# Patient Record
Sex: Female | Born: 1965 | Race: White | Hispanic: No | Marital: Single | Smoking: Former smoker
Health system: Southern US, Community
[De-identification: ages and names within clinical notes are randomized; demographics above are authoritative.]

## PROBLEM LIST (undated history)

## (undated) DIAGNOSIS — E669 Obesity, unspecified: Secondary | ICD-10-CM

## (undated) DIAGNOSIS — Z86010 Personal history of colonic polyps: Secondary | ICD-10-CM

## (undated) DIAGNOSIS — L732 Hidradenitis suppurativa: Secondary | ICD-10-CM

## (undated) DIAGNOSIS — D649 Anemia, unspecified: Secondary | ICD-10-CM

## (undated) DIAGNOSIS — J45909 Unspecified asthma, uncomplicated: Secondary | ICD-10-CM

## (undated) DIAGNOSIS — D219 Benign neoplasm of connective and other soft tissue, unspecified: Secondary | ICD-10-CM

## (undated) DIAGNOSIS — K219 Gastro-esophageal reflux disease without esophagitis: Secondary | ICD-10-CM

## (undated) DIAGNOSIS — L709 Acne, unspecified: Secondary | ICD-10-CM

## (undated) DIAGNOSIS — F411 Generalized anxiety disorder: Secondary | ICD-10-CM

## (undated) DIAGNOSIS — E0590 Thyrotoxicosis, unspecified without thyrotoxic crisis or storm: Secondary | ICD-10-CM

## (undated) DIAGNOSIS — T7840XA Allergy, unspecified, initial encounter: Secondary | ICD-10-CM

## (undated) DIAGNOSIS — Z9884 Bariatric surgery status: Secondary | ICD-10-CM

## (undated) HISTORY — PX: MYOMECTOMY: SHX85

## (undated) HISTORY — DX: Thyrotoxicosis, unspecified without thyrotoxic crisis or storm: E05.90

## (undated) HISTORY — DX: Generalized anxiety disorder: F41.1

## (undated) HISTORY — DX: Gastro-esophageal reflux disease without esophagitis: K21.9

## (undated) HISTORY — DX: Hidradenitis suppurativa: L73.2

## (undated) HISTORY — DX: Anemia, unspecified: D64.9

## (undated) HISTORY — DX: Acne, unspecified: L70.9

## (undated) HISTORY — PX: GASTRIC BYPASS: SHX52

## (undated) HISTORY — PX: TUBAL LIGATION: SHX77

## (undated) HISTORY — DX: Bariatric surgery status: Z98.84

## (undated) HISTORY — DX: Obesity, unspecified: E66.9

## (undated) HISTORY — PX: PILONIDAL CYST EXCISION: SHX744

## (undated) HISTORY — DX: Personal history of colonic polyps: Z86.010

## (undated) HISTORY — DX: Unspecified asthma, uncomplicated: J45.909

## (undated) HISTORY — PX: BREAST SURGERY: SHX581

## (undated) HISTORY — DX: Allergy, unspecified, initial encounter: T78.40XA

---

## 2004-03-03 ENCOUNTER — Ambulatory Visit: Payer: Self-pay | Admitting: Gastroenterology

## 2004-03-15 ENCOUNTER — Ambulatory Visit: Payer: Self-pay | Admitting: Endocrinology

## 2004-04-03 ENCOUNTER — Encounter (HOSPITAL_COMMUNITY): Admission: RE | Admit: 2004-04-03 | Discharge: 2004-04-20 | Payer: Self-pay | Admitting: Endocrinology

## 2004-08-02 ENCOUNTER — Ambulatory Visit: Payer: Self-pay | Admitting: Internal Medicine

## 2004-09-26 ENCOUNTER — Ambulatory Visit: Payer: Self-pay | Admitting: Internal Medicine

## 2005-06-06 ENCOUNTER — Ambulatory Visit: Payer: Self-pay | Admitting: Internal Medicine

## 2005-07-11 ENCOUNTER — Ambulatory Visit: Payer: Self-pay | Admitting: Internal Medicine

## 2005-07-24 ENCOUNTER — Encounter: Admission: RE | Admit: 2005-07-24 | Discharge: 2005-07-24 | Payer: Self-pay | Admitting: Internal Medicine

## 2005-08-30 ENCOUNTER — Ambulatory Visit: Payer: Self-pay | Admitting: Internal Medicine

## 2006-06-20 ENCOUNTER — Ambulatory Visit: Payer: Self-pay | Admitting: Internal Medicine

## 2006-06-20 LAB — CONVERTED CEMR LAB
BUN: 6 mg/dL (ref 6–23)
Basophils Absolute: 0 10*3/uL (ref 0.0–0.1)
Basophils Relative: 0.7 % (ref 0.0–1.0)
CO2: 29 meq/L (ref 19–32)
Calcium: 9.5 mg/dL (ref 8.4–10.5)
Chloride: 103 meq/L (ref 96–112)
Creatinine, Ser: 0.7 mg/dL (ref 0.4–1.2)
Eosinophils Absolute: 0.1 10*3/uL (ref 0.0–0.6)
Eosinophils Relative: 1.1 % (ref 0.0–5.0)
GFR calc Af Amer: 119 mL/min
GFR calc non Af Amer: 98 mL/min
Glucose, Bld: 85 mg/dL (ref 70–99)
HCT: 39.6 % (ref 36.0–46.0)
Hemoglobin: 13.4 g/dL (ref 12.0–15.0)
Lymphocytes Relative: 33.6 % (ref 12.0–46.0)
MCHC: 33.9 g/dL (ref 30.0–36.0)
MCV: 92.6 fL (ref 78.0–100.0)
Monocytes Absolute: 0.4 10*3/uL (ref 0.2–0.7)
Monocytes Relative: 6 % (ref 3.0–11.0)
Neutro Abs: 4.1 10*3/uL (ref 1.4–7.7)
Neutrophils Relative %: 58.6 % (ref 43.0–77.0)
Platelets: 388 10*3/uL (ref 150–400)
Potassium: 5.3 meq/L — ABNORMAL HIGH (ref 3.5–5.1)
RBC: 4.28 M/uL (ref 3.87–5.11)
RDW: 12.2 % (ref 11.5–14.6)
Sodium: 137 meq/L (ref 135–145)
TSH: 1.5 microintl units/mL (ref 0.35–5.50)
WBC: 7 10*3/uL (ref 4.5–10.5)

## 2006-10-01 ENCOUNTER — Ambulatory Visit: Payer: Self-pay | Admitting: Internal Medicine

## 2006-11-04 ENCOUNTER — Ambulatory Visit: Payer: Self-pay | Admitting: Internal Medicine

## 2006-12-11 DIAGNOSIS — J45909 Unspecified asthma, uncomplicated: Secondary | ICD-10-CM | POA: Insufficient documentation

## 2006-12-11 DIAGNOSIS — Z860100 Personal history of colon polyps, unspecified: Secondary | ICD-10-CM | POA: Insufficient documentation

## 2006-12-11 DIAGNOSIS — K219 Gastro-esophageal reflux disease without esophagitis: Secondary | ICD-10-CM

## 2006-12-11 HISTORY — DX: Personal history of colonic polyps: Z86.010

## 2006-12-11 HISTORY — DX: Personal history of colon polyps, unspecified: Z86.0100

## 2006-12-11 HISTORY — DX: Unspecified asthma, uncomplicated: J45.909

## 2006-12-11 HISTORY — DX: Gastro-esophageal reflux disease without esophagitis: K21.9

## 2007-02-07 ENCOUNTER — Ambulatory Visit: Payer: Self-pay | Admitting: Internal Medicine

## 2007-02-20 ENCOUNTER — Ambulatory Visit: Payer: Self-pay | Admitting: Gastroenterology

## 2007-03-04 ENCOUNTER — Ambulatory Visit: Payer: Self-pay | Admitting: Gastroenterology

## 2007-08-04 ENCOUNTER — Ambulatory Visit: Payer: Self-pay | Admitting: Internal Medicine

## 2008-05-10 ENCOUNTER — Ambulatory Visit: Payer: Self-pay | Admitting: Internal Medicine

## 2008-05-10 DIAGNOSIS — E669 Obesity, unspecified: Secondary | ICD-10-CM | POA: Insufficient documentation

## 2008-05-10 HISTORY — DX: Obesity, unspecified: E66.9

## 2008-06-09 ENCOUNTER — Telehealth: Payer: Self-pay | Admitting: Internal Medicine

## 2008-06-11 ENCOUNTER — Encounter: Payer: Self-pay | Admitting: Internal Medicine

## 2008-06-15 ENCOUNTER — Encounter: Payer: Self-pay | Admitting: Internal Medicine

## 2008-07-09 ENCOUNTER — Encounter: Payer: Self-pay | Admitting: Gastroenterology

## 2008-07-19 ENCOUNTER — Encounter: Payer: Self-pay | Admitting: Internal Medicine

## 2008-07-22 ENCOUNTER — Ambulatory Visit (HOSPITAL_COMMUNITY): Admission: RE | Admit: 2008-07-22 | Discharge: 2008-07-22 | Payer: Self-pay | Admitting: Surgery

## 2008-07-29 ENCOUNTER — Ambulatory Visit (HOSPITAL_COMMUNITY): Admission: RE | Admit: 2008-07-29 | Discharge: 2008-07-29 | Payer: Self-pay | Admitting: Surgery

## 2008-07-29 ENCOUNTER — Encounter: Admission: RE | Admit: 2008-07-29 | Discharge: 2008-07-29 | Payer: Self-pay | Admitting: Surgery

## 2008-08-12 ENCOUNTER — Ambulatory Visit: Payer: Self-pay | Admitting: Internal Medicine

## 2008-09-29 ENCOUNTER — Encounter: Payer: Self-pay | Admitting: Internal Medicine

## 2008-10-21 ENCOUNTER — Encounter: Admission: RE | Admit: 2008-10-21 | Discharge: 2009-01-19 | Payer: Self-pay | Admitting: Surgery

## 2008-11-02 ENCOUNTER — Ambulatory Visit: Payer: Self-pay | Admitting: Vascular Surgery

## 2008-11-02 ENCOUNTER — Inpatient Hospital Stay (HOSPITAL_COMMUNITY): Admission: RE | Admit: 2008-11-02 | Discharge: 2008-11-04 | Payer: Self-pay | Admitting: Surgery

## 2008-11-03 ENCOUNTER — Encounter (INDEPENDENT_AMBULATORY_CARE_PROVIDER_SITE_OTHER): Payer: Self-pay | Admitting: Surgery

## 2008-11-14 ENCOUNTER — Inpatient Hospital Stay (HOSPITAL_COMMUNITY): Admission: EM | Admit: 2008-11-14 | Discharge: 2008-11-14 | Payer: Self-pay | Admitting: Emergency Medicine

## 2008-11-19 ENCOUNTER — Encounter: Payer: Self-pay | Admitting: Gastroenterology

## 2008-11-23 ENCOUNTER — Ambulatory Visit: Payer: Self-pay | Admitting: Family Medicine

## 2008-12-23 ENCOUNTER — Encounter: Payer: Self-pay | Admitting: Gastroenterology

## 2009-01-07 ENCOUNTER — Ambulatory Visit (HOSPITAL_COMMUNITY): Admission: RE | Admit: 2009-01-07 | Discharge: 2009-01-07 | Payer: Self-pay | Admitting: Surgery

## 2009-01-25 ENCOUNTER — Encounter: Admission: RE | Admit: 2009-01-25 | Discharge: 2009-01-25 | Payer: Self-pay | Admitting: Surgery

## 2009-02-08 ENCOUNTER — Encounter (INDEPENDENT_AMBULATORY_CARE_PROVIDER_SITE_OTHER): Payer: Self-pay | Admitting: *Deleted

## 2009-02-09 ENCOUNTER — Ambulatory Visit: Payer: Self-pay | Admitting: Internal Medicine

## 2009-02-16 ENCOUNTER — Ambulatory Visit: Payer: Self-pay | Admitting: Internal Medicine

## 2009-02-16 DIAGNOSIS — Z9884 Bariatric surgery status: Secondary | ICD-10-CM | POA: Insufficient documentation

## 2009-02-16 DIAGNOSIS — F411 Generalized anxiety disorder: Secondary | ICD-10-CM

## 2009-02-16 HISTORY — DX: Generalized anxiety disorder: F41.1

## 2009-02-16 HISTORY — DX: Bariatric surgery status: Z98.84

## 2009-02-18 LAB — CONVERTED CEMR LAB
ALT: 16 units/L (ref 0–35)
AST: 16 units/L (ref 0–37)
Albumin: 3.5 g/dL (ref 3.5–5.2)
Alkaline Phosphatase: 75 units/L (ref 39–117)
BUN: 10 mg/dL (ref 6–23)
Basophils Absolute: 0 10*3/uL (ref 0.0–0.1)
Basophils Relative: 0.8 % (ref 0.0–3.0)
Bilirubin, Direct: 0.1 mg/dL (ref 0.0–0.3)
CO2: 26 meq/L (ref 19–32)
Calcium: 8.8 mg/dL (ref 8.4–10.5)
Chloride: 104 meq/L (ref 96–112)
Creatinine, Ser: 0.7 mg/dL (ref 0.4–1.2)
Eosinophils Absolute: 0.1 10*3/uL (ref 0.0–0.7)
Eosinophils Relative: 1.2 % (ref 0.0–5.0)
GFR calc non Af Amer: 96.46 mL/min (ref >60)
Glucose, Bld: 83 mg/dL (ref 70–99)
HCT: 37.2 % (ref 36.0–46.0)
Hemoglobin: 12.4 g/dL (ref 12.0–15.0)
Lymphocytes Relative: 35.4 % (ref 12.0–46.0)
Lymphs Abs: 2.1 10*3/uL (ref 0.7–4.0)
MCHC: 33.5 g/dL (ref 30.0–36.0)
MCV: 97.2 fL (ref 78.0–100.0)
Monocytes Absolute: 0.4 10*3/uL (ref 0.1–1.0)
Monocytes Relative: 7.1 % (ref 3.0–12.0)
Neutro Abs: 3.2 10*3/uL (ref 1.4–7.7)
Neutrophils Relative %: 55.5 % (ref 43.0–77.0)
Platelets: 331 10*3/uL (ref 150.0–400.0)
Potassium: 4.3 meq/L (ref 3.5–5.1)
RBC: 3.82 M/uL — ABNORMAL LOW (ref 3.87–5.11)
RDW: 12.7 % (ref 11.5–14.6)
Sodium: 136 meq/L (ref 135–145)
TSH: 0.86 microintl units/mL (ref 0.35–5.50)
Total Bilirubin: 0.6 mg/dL (ref 0.3–1.2)
Total Protein: 7.6 g/dL (ref 6.0–8.3)
WBC: 5.8 10*3/uL (ref 4.5–10.5)

## 2009-03-03 ENCOUNTER — Encounter (INDEPENDENT_AMBULATORY_CARE_PROVIDER_SITE_OTHER): Payer: Self-pay | Admitting: *Deleted

## 2009-03-03 ENCOUNTER — Encounter: Payer: Self-pay | Admitting: Gastroenterology

## 2009-03-15 ENCOUNTER — Ambulatory Visit: Payer: Self-pay | Admitting: Internal Medicine

## 2009-04-26 ENCOUNTER — Ambulatory Visit: Payer: Self-pay | Admitting: Internal Medicine

## 2009-09-05 ENCOUNTER — Telehealth: Payer: Self-pay | Admitting: Internal Medicine

## 2009-10-26 ENCOUNTER — Ambulatory Visit: Payer: Self-pay | Admitting: Internal Medicine

## 2009-10-26 LAB — CONVERTED CEMR LAB
Blood in Urine, dipstick: NEGATIVE
Free T4: 1.62 ng/dL — ABNORMAL HIGH (ref 0.60–1.60)
Glucose, Urine, Semiquant: NEGATIVE
Ketones, urine, test strip: NEGATIVE
Nitrite: NEGATIVE
Protein, U semiquant: NEGATIVE
Specific Gravity, Urine: 1.025
T3, Free: 3.7 pg/mL (ref 2.3–4.2)
Urobilinogen, UA: 0.2
WBC Urine, dipstick: NEGATIVE
pH: 5.5

## 2009-10-27 LAB — CONVERTED CEMR LAB
ALT: 14 units/L (ref 0–35)
AST: 16 units/L (ref 0–37)
Albumin: 3.6 g/dL (ref 3.5–5.2)
Alkaline Phosphatase: 92 units/L (ref 39–117)
BUN: 10 mg/dL (ref 6–23)
Basophils Absolute: 0 10*3/uL (ref 0.0–0.1)
Basophils Relative: 0.2 % (ref 0.0–3.0)
Bilirubin, Direct: 0.2 mg/dL (ref 0.0–0.3)
CO2: 27 meq/L (ref 19–32)
Calcium: 9 mg/dL (ref 8.4–10.5)
Chloride: 108 meq/L (ref 96–112)
Cholesterol: 139 mg/dL (ref 0–200)
Creatinine, Ser: 0.5 mg/dL (ref 0.4–1.2)
Eosinophils Absolute: 0.1 10*3/uL (ref 0.0–0.7)
Eosinophils Relative: 1 % (ref 0.0–5.0)
Folate: 13.6 ng/mL
GFR calc non Af Amer: 132.57 mL/min (ref >60)
Glucose, Bld: 81 mg/dL (ref 70–99)
HCT: 36.1 % (ref 36.0–46.0)
HDL: 57.3 mg/dL (ref >39.00)
Hemoglobin: 12.2 g/dL (ref 12.0–15.0)
Iron: 133 ug/dL (ref 42–145)
LDL Cholesterol: 73 mg/dL (ref 0–99)
Lymphocytes Relative: 33.7 % (ref 12.0–46.0)
Lymphs Abs: 1.8 10*3/uL (ref 0.7–4.0)
MCHC: 33.9 g/dL (ref 30.0–36.0)
MCV: 92.3 fL (ref 78.0–100.0)
Magnesium: 1.9 mg/dL (ref 1.5–2.5)
Monocytes Absolute: 0.3 10*3/uL (ref 0.1–1.0)
Monocytes Relative: 5.4 % (ref 3.0–12.0)
Neutro Abs: 3.3 10*3/uL (ref 1.4–7.7)
Neutrophils Relative %: 59.7 % (ref 43.0–77.0)
Platelets: 318 10*3/uL (ref 150.0–400.0)
Potassium: 4.6 meq/L (ref 3.5–5.1)
RBC: 3.91 M/uL (ref 3.87–5.11)
RDW: 12.8 % (ref 11.5–14.6)
Saturation Ratios: 40.6 % (ref 20.0–50.0)
Sodium: 140 meq/L (ref 135–145)
TSH: 0.05 microintl units/mL — ABNORMAL LOW (ref 0.35–5.50)
Total Bilirubin: 0.7 mg/dL (ref 0.3–1.2)
Total CHOL/HDL Ratio: 2
Total Protein: 7 g/dL (ref 6.0–8.3)
Transferrin: 233.8 mg/dL (ref 212.0–360.0)
Triglycerides: 42 mg/dL (ref 0.0–149.0)
VLDL: 8.4 mg/dL (ref 0.0–40.0)
Vitamin B-12: 479 pg/mL (ref 211–911)
WBC: 5.5 10*3/uL (ref 4.5–10.5)

## 2009-11-02 ENCOUNTER — Ambulatory Visit: Payer: Self-pay | Admitting: Internal Medicine

## 2009-11-02 DIAGNOSIS — E0590 Thyrotoxicosis, unspecified without thyrotoxic crisis or storm: Secondary | ICD-10-CM | POA: Insufficient documentation

## 2009-11-02 HISTORY — DX: Thyrotoxicosis, unspecified without thyrotoxic crisis or storm: E05.90

## 2010-01-06 ENCOUNTER — Ambulatory Visit: Payer: Self-pay | Admitting: Internal Medicine

## 2010-01-09 LAB — CONVERTED CEMR LAB
Free T4: 0.69 ng/dL (ref 0.60–1.60)
T3, Free: 2.4 pg/mL (ref 2.3–4.2)
TSH: 1.25 microintl units/mL (ref 0.35–5.50)

## 2010-05-04 ENCOUNTER — Ambulatory Visit: Admit: 2010-05-04 | Payer: Self-pay | Admitting: Internal Medicine

## 2010-05-13 ENCOUNTER — Encounter: Payer: Self-pay | Admitting: Endocrinology

## 2010-05-23 NOTE — Assessment & Plan Note (Signed)
Summary: 6 WKS ROV/MM   Vital Signs:  Patient profile:   45 year old female Weight:      200 pounds Temp:     97.4 degrees F oral BP sitting:   108 / 78  (left arm) Cuff size:   regular  Vitals Entered By: Kern Reap CMA Duncan Dull) (April 26, 2009 8:43 AM)  Reason for Visit follow up medication, chest  congestion, cough   History of Present Illness: URI sxs for 2 weeks -- gradually improving.  Still has some cough---no fever or chills  anxiety---did not like effexor---"caused me too be loopy" . she resmued citalopram---doing well. feels anxiety is much better  weight---had bariatric surgery -- July 2010. Max weight 254  All other systems reviewed and were negative   Allergies (verified): No Known Drug Allergies  Past History:  Past Surgical History: Breast Cysts pyelonidal cyst CB X2 Colonoscopy November 02 2008---Gastric Bypass Ezzard Standing)  Family History: Reviewed history from 02/07/2007 and no changes required. Family History of Colon CA 1st degree relative <60 Family History Diabetes 1st degree relative-father Family History Hypertension Family History of Stroke M 1st degree relative <50  Family History of Digestive disorder  Social History: Reviewed history from 12/11/2006 and no changes required. Former Smoker Occupation: Single Alcohol use-no Drug use-no Regular exercise-yes  Review of Systems       All other systems reviewed and were negative   Physical Exam  General:  alert and well-developed.   Head:  normocephalic and atraumatic.   Eyes:  pupils equal and pupils round.   Ears:  R ear normal and L ear normal.   Neck:  No deformities, masses, or tenderness noted. Chest Wall:  No deformities, masses, or tenderness noted. Lungs:  Normal respiratory effort, chest expands symmetrically. Lungs are clear to auscultation, no crackles or wheezes. Heart:  regular rhythm and no murmur.   Abdomen:  Bowel sounds positive,abdomen soft and non-tender without  masses, organomegaly or hernias noted. Msk:  No deformity or scoliosis noted of thoracic or lumbar spine.   Neurologic:  cranial nerves II-XII intact and gait normal.   Skin:  turgor normal and color normal.   Psych:  memory intact for recent and remote and good eye contact.     Impression & Recommendations:  Problem # 1:  ANXIETY STATE, UNSPECIFIED (ICD-300.00) continue celexa call for any side effects  Problem # 2:  URI (ICD-465.9) no evidence of bacterial infection. call for any concerns, increased sxs, fever, persistence of sxs, wheeze, SOB.    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 Take one tablet by mouth daily 2)  Qvar 40 Mcg/act Aers (Beclomethasone dipropionate) .... 2 puffs 2 times daily  Patient Instructions: 1)  Please schedule a follow-up appointment in 6 months. Prescriptions: QVAR 40 MCG/ACT AERS (BECLOMETHASONE DIPROPIONATE) 2 puffs 2 times daily  #3 x 3   Entered and Authorized by:   Birdie Sons MD   Signed by:   Birdie Sons MD on 04/26/2009   Method used:   Electronically to        [facility]* (retail)       [address] [place], [place]  [address]       Ph: [phone]       Fax: [phone]   RxID:   [id number] CITALOPRAM  HYDROBROMIDE 20 MG TABS (CITALOPRAM HYDROBROMIDE) take one tablet by mouth daily  #90 x 3   Entered and Authorized by:   Birdie Sons MD   Signed by:   Birdie Sons MD on 04/26/2009   Method used:   Historical   RxID:   [id number]

## 2010-05-23 NOTE — Assessment & Plan Note (Signed)
Summary: cpx/no pap/njr   Vital Signs:  Patient profile:   45 year old female Menstrual status:  regular LMP:     10/18/2009 Height:      65 inches Weight:      186 pounds BMI:     31.06 Pulse rate:   60 / minute Pulse rhythm:   regular Resp:     12 per minute BP sitting:   114 / 70  (left arm) Cuff size:   regular  Vitals Entered By: Gladis Riffle, RN (November 02, 2009 9:27 AM)  Nutrition Counseling: Patient's BMI is greater than 25 and therefore counseled on weight management options. CC: cpx, labs done--has gyn--was on sucralfate liquid qid and wants to change to tablet Is Patient Diabetic? No LMP (date): 10/18/2009     Menstrual Status regular Enter LMP: 10/18/2009   CC:  cpx and labs done--has gyn--was on sucralfate liquid qid and wants to change to tablet.  History of Present Illness: cpx  Preventive Screening-Counseling & Management  Alcohol-Tobacco     Smoking Status: quit > 6 months     Year Started: 1989     Year Quit: 2001  Problems Prior to Update: 1)  Bariatric Surgery Status  (ICD-V45.86) 2)  Anxiety State, Unspecified  (ICD-300.00) 3)  Obesity  (ICD-278.00) 4)  Colonic Polyps, Hx of  (ICD-V12.72) 5)  Gerd  (ICD-530.81) 6)  Asthma  (ICD-493.90)  Current Problems (verified): 1)  Bariatric Surgery Status  (ICD-V45.86) 2)  Anxiety State, Unspecified  (ICD-300.00) 3)  Obesity  (ICD-278.00) 4)  Colonic Polyps, Hx of  (ICD-V12.72) 5)  Gerd  (ICD-530.81) 6)  Asthma  (ICD-493.90)  Medications Prior to Update: 1)  Citalopram Hydrobromide 20 Mg Tabs (Citalopram Hydrobromide) .... Take One Tablet By Mouth Daily 2)  Qvar 40 Mcg/act Aers (Beclomethasone Dipropionate) .... 2 Puffs 2 Times Daily  Current Medications (verified): 1)  Citalopram Hydrobromide 20 Mg Tabs (Citalopram Hydrobromide) .... Take One Tablet By Mouth Daily 2)  Qvar 40 Mcg/act Aers (Beclomethasone Dipropionate) .... 2 Puffs 2 Times Daily 3)  Sucralfate 1 Gm Tabs (Sucralfate) .... Take 1  Tablet By Mouth Four Times A Day  Allergies (verified): No Known Drug Allergies  Past History:  Past Medical History: Last updated: 12/11/2006 Rectal Polyp Acne Asthma GERD Colonic polyps, hx of  Past Surgical History: Last updated: 04/26/2009 Breast Cysts pyelonidal cyst CB X2 Colonoscopy November 02 2008---Gastric Bypass Ezzard Standing)  Family History: Last updated: 02/07/2007 Family History of Colon CA 1st degree relative <60 Family History Diabetes 1st degree relative-father Family History Hypertension Family History of Stroke M 1st degree relative <50  Family History of Digestive disorder  Social History: Last updated: 11/02/2009 Former Smoker Occupation: Single Alcohol use-no Drug use-no Regular exercise-yes--- 4 times weekly  Risk Factors: Exercise: yes (12/11/2006)  Risk Factors: Smoking Status: quit > 6 months (11/02/2009)  Social History: Former Smoker Occupation: Single Alcohol use-no Drug use-no Regular exercise-yes--- 4 times weekly  Physical Exam  General:  alert and well-developed.   Head:  normocephalic and atraumatic.   Eyes:  pupils equal and pupils round.   Ears:  R ear normal and L ear normal.   Neck:  No deformities, masses, or tenderness noted. Chest Wall:  No deformities, masses, or tenderness noted. Lungs:  Normal respiratory effort, chest expands symmetrically. Lungs are clear to auscultation, no crackles or wheezes. Heart:  regular rhythm and no murmur.   Abdomen:  Bowel sounds positive,abdomen soft and non-tender without masses, organomegaly or hernias noted. Msk:  No deformity or scoliosis noted of thoracic or lumbar spine.   Extremities:  No clubbing, cyanosis, edema, or deformity noted   Neurologic:  cranial nerves II-XII intact and gait normal.     Impression & Recommendations:  Problem # 1:  Preventive Health Care (ICD-V70.0) health maint continue exercise habits has had gastric bypass e=nocuraged her to maintain a low  calorie diet goal weight of 150 pounds discussed  Problem # 2:  HYPERTHYROIDISM (ICD-242.90) based on tsh t3 and t4 are normal will recheck in 2 months I have discussed with her sxs of hyperthyroid  Complete Medication List: 1)  Citalopram Hydrobromide 20 Mg Tabs (Citalopram hydrobromide) .... Take one tablet by mouth daily 2)  Qvar 40 Mcg/act Aers (Beclomethasone dipropionate) .... 2 puffs 2 times daily 3)  Sucralfate 1 Gm Tabs (Sucralfate) .... Take 1 tablet by mouth four times a day  Patient Instructions: 1)  2 months--labs only 2)  tsh 3)  free T3 4)  free T4 5)  Schedule 6 month office visit Prescriptions: SUCRALFATE 1 GM TABS (SUCRALFATE) Take 1 tablet by mouth four times a day  #100 x 3   Entered and Authorized by:   Birdie Sons MD   Signed by:   Birdie Sons MD on 11/02/2009   Method used:   Electronically to        [facility]  [phone]* (retail)       [address]       [place], [place]  [address]       Ph: [phone] or [phone]       Fax: [phone]   RxID:   [id number]    Immunization History:  Tetanus/Td Immunization History:    Tetanus/Td:  historical (08/02/2004)

## 2010-05-23 NOTE — Progress Notes (Signed)
Summary: Pt req to have additional labs done during cpx labs in July  Phone Note Call from Patient Call back at Home Phone [phone]   Caller: Patient Summary of Call: Pt had gastro bypass surgery done on 11/02/08 and she had orders to get lab work done to get b-complex and various other labs. Pt can't remember what other labs were ordered. Pt is sch to have cpx labs done on 11/02/09 and is req that labs be added. Pt is wondering is she can bring the old lab order with her to cpx lab appt? Please advise.      Initial call taken by: Lucy Antigua,  Sep 05, 2009 10:36 AM  Follow-up for Phone Call        yes Follow-up by: Birdie Sons MD,  Sep 05, 2009 12:53 PM  Additional Follow-up for Phone Call Additional follow up Details #1::        Pt has been informed. Additional Follow-up by: Lucy Antigua,  Sep 05, 2009 2:47 PM

## 2010-07-30 LAB — DIFFERENTIAL
Basophils Absolute: 0 10*3/uL (ref 0.0–0.1)
Basophils Absolute: 0 10*3/uL (ref 0.0–0.1)
Basophils Absolute: 0 10*3/uL (ref 0.0–0.1)
Basophils Relative: 0 % (ref 0–1)
Basophils Relative: 0 % (ref 0–1)
Basophils Relative: 0 % (ref 0–1)
Basophils Relative: 0 % (ref 0–1)
Eosinophils Absolute: 0.1 10*3/uL (ref 0.0–0.7)
Eosinophils Absolute: 0 10*3/uL (ref 0.0–0.7)
Eosinophils Absolute: 0.1 10*3/uL (ref 0.0–0.7)
Eosinophils Relative: 2 % (ref 0–5)
Eosinophils Relative: 1 % (ref 0–5)
Lymphocytes Relative: 30 % (ref 12–46)
Lymphocytes Relative: 41 % (ref 12–46)
Lymphs Abs: 2.3 10*3/uL (ref 0.7–4.0)
Lymphs Abs: 3 10*3/uL (ref 0.7–4.0)
Monocytes Absolute: 0.6 10*3/uL (ref 0.1–1.0)
Monocytes Absolute: 0.4 10*3/uL (ref 0.1–1.0)
Monocytes Absolute: 0.4 10*3/uL (ref 0.1–1.0)
Monocytes Absolute: 0.7 10*3/uL (ref 0.1–1.0)
Monocytes Relative: 8 % (ref 3–12)
Monocytes Relative: 6 % (ref 3–12)
Monocytes Relative: 6 % (ref 3–12)
Neutro Abs: 4.6 10*3/uL (ref 1.7–7.7)
Neutro Abs: 3.8 10*3/uL (ref 1.7–7.7)
Neutro Abs: 4.5 10*3/uL (ref 1.7–7.7)
Neutrophils Relative %: 60 % (ref 43–77)
Neutrophils Relative %: 51 % (ref 43–77)
Neutrophils Relative %: 66 % (ref 43–77)

## 2010-07-30 LAB — CBC
HCT: 34.1 % — ABNORMAL LOW (ref 36.0–46.0)
HCT: 34.9 % — ABNORMAL LOW (ref 36.0–46.0)
HCT: 37.1 % (ref 36.0–46.0)
HCT: 38.7 % (ref 36.0–46.0)
Hemoglobin: 11.3 g/dL — ABNORMAL LOW (ref 12.0–15.0)
Hemoglobin: 11.9 g/dL — ABNORMAL LOW (ref 12.0–15.0)
Hemoglobin: 11.7 g/dL — ABNORMAL LOW (ref 12.0–15.0)
Hemoglobin: 12.5 g/dL (ref 12.0–15.0)
Hemoglobin: 13.1 g/dL (ref 12.0–15.0)
MCHC: 33.1 g/dL (ref 30.0–36.0)
MCHC: 33.9 g/dL (ref 30.0–36.0)
MCHC: 33.7 g/dL (ref 30.0–36.0)
MCHC: 33.8 g/dL (ref 30.0–36.0)
MCHC: 33.9 g/dL (ref 30.0–36.0)
MCV: 93.4 fL (ref 78.0–100.0)
MCV: 93.5 fL (ref 78.0–100.0)
MCV: 92.8 fL (ref 78.0–100.0)
MCV: 94 fL (ref 78.0–100.0)
Platelets: 299 10*3/uL (ref 150–400)
Platelets: 307 10*3/uL (ref 150–400)
Platelets: 366 10*3/uL (ref 150–400)
RBC: 3.65 MIL/uL — ABNORMAL LOW (ref 3.87–5.11)
RBC: 3.74 MIL/uL — ABNORMAL LOW (ref 3.87–5.11)
RBC: 3.94 MIL/uL (ref 3.87–5.11)
RBC: 4.17 MIL/uL (ref 3.87–5.11)
RDW: 13 % (ref 11.5–15.5)
RDW: 13.2 % (ref 11.5–15.5)
RDW: 13 % (ref 11.5–15.5)
RDW: 13.1 % (ref 11.5–15.5)
WBC: 6.8 10*3/uL (ref 4.0–10.5)
WBC: 7.7 10*3/uL (ref 4.0–10.5)
WBC: 7.3 10*3/uL (ref 4.0–10.5)

## 2010-07-30 LAB — COMPREHENSIVE METABOLIC PANEL
ALT: 71 U/L — ABNORMAL HIGH (ref 0–35)
ALT: 91 U/L — ABNORMAL HIGH (ref 0–35)
ALT: 16 U/L (ref 0–35)
AST: 51 U/L — ABNORMAL HIGH (ref 0–37)
AST: 59 U/L — ABNORMAL HIGH (ref 0–37)
AST: 18 U/L (ref 0–37)
Albumin: 3 g/dL — ABNORMAL LOW (ref 3.5–5.2)
Albumin: 3.6 g/dL (ref 3.5–5.2)
Albumin: 3.8 g/dL (ref 3.5–5.2)
Alkaline Phosphatase: 68 U/L (ref 39–117)
Alkaline Phosphatase: 80 U/L (ref 39–117)
Alkaline Phosphatase: 77 U/L (ref 39–117)
BUN: 10 mg/dL (ref 6–23)
BUN: 8 mg/dL (ref 6–23)
BUN: 12 mg/dL (ref 6–23)
CO2: 23 mEq/L (ref 19–32)
CO2: 23 mEq/L (ref 19–32)
CO2: 26 mEq/L (ref 19–32)
Calcium: 8.4 mg/dL (ref 8.4–10.5)
Calcium: 9 mg/dL (ref 8.4–10.5)
Calcium: 9.3 mg/dL (ref 8.4–10.5)
Chloride: 106 mEq/L (ref 96–112)
Chloride: 107 mEq/L (ref 96–112)
Chloride: 102 mEq/L (ref 96–112)
Creatinine, Ser: 0.67 mg/dL (ref 0.4–1.2)
Creatinine, Ser: 0.74 mg/dL (ref 0.4–1.2)
Creatinine, Ser: 0.68 mg/dL (ref 0.4–1.2)
GFR calc Af Amer: >60 mL/min (ref >60)
GFR calc Af Amer: >60 mL/min (ref >60)
GFR calc Af Amer: >60 mL/min (ref >60)
GFR calc non Af Amer: >60 mL/min (ref >60)
GFR calc non Af Amer: >60 mL/min (ref >60)
GFR calc non Af Amer: >60 mL/min (ref >60)
Glucose, Bld: 75 mg/dL (ref 70–99)
Glucose, Bld: 79 mg/dL (ref 70–99)
Glucose, Bld: 79 mg/dL (ref 70–99)
Potassium: 4.4 mEq/L (ref 3.5–5.1)
Potassium: 4.7 mEq/L (ref 3.5–5.1)
Potassium: 4.4 mEq/L (ref 3.5–5.1)
Sodium: 137 mEq/L (ref 135–145)
Sodium: 138 mEq/L (ref 135–145)
Sodium: 136 mEq/L (ref 135–145)
Total Bilirubin: 1 mg/dL (ref 0.3–1.2)
Total Bilirubin: 1.3 mg/dL — ABNORMAL HIGH (ref 0.3–1.2)
Total Bilirubin: 0.5 mg/dL (ref 0.3–1.2)
Total Protein: 6.6 g/dL (ref 6.0–8.3)
Total Protein: 7.8 g/dL (ref 6.0–8.3)
Total Protein: 8.2 g/dL (ref 6.0–8.3)

## 2010-07-30 LAB — POCT PREGNANCY, URINE: Preg Test, Ur: NEGATIVE

## 2010-07-30 LAB — URINALYSIS, ROUTINE W REFLEX MICROSCOPIC
Glucose, UA: NEGATIVE mg/dL
Hgb urine dipstick: NEGATIVE
Ketones, ur: >80 mg/dL — AB
Nitrite: NEGATIVE
Protein, ur: NEGATIVE mg/dL
Specific Gravity, Urine: 1.028 (ref 1.005–1.030)
Urobilinogen, UA: 0.2 mg/dL (ref 0.0–1.0)
pH: 5.5 (ref 5.0–8.0)

## 2010-07-30 LAB — PREGNANCY, URINE: Preg Test, Ur: NEGATIVE

## 2010-07-30 LAB — LIPASE, BLOOD: Lipase: 19 U/L (ref 11–59)

## 2010-09-05 NOTE — Op Note (Signed)
Annette Brooks, Annette Brooks NO.:  [id number]   MEDICAL RECORD NO.:  [id number]          PATIENT TYPE:  INP   LOCATION:  1534                         FACILITY:  [facility]   PHYSICIAN:  Sandria Bales. Ezzard Standing, M.D.  DATE OF BIRTH:  May 09, 1965   DATE OF PROCEDURE:  DATE OF DISCHARGE:                               OPERATIVE REPORT   Date of Surgery ?   PREOPERATIVE DIAGNOSIS:  Morbid obesity (weight 253, BMI of 42.2).   POSTOPERATIVE DIAGNOSIS:  Morbid obesity (weight 253, BMI 42.2).   PROCEDURE:  Laparoscopic Roux-en-Y gastric bypass (antecolic  antegastric), upper endoscopy.   SURGEON:  Sandria Bales. Ezzard Standing, M.D.   FIRST ASSISTANT:  Thornton Park. Daphine Deutscher, M.D.   ANESTHESIA:  General endotracheal.   ESTIMATED BLOOD LOSS:  Minimal.   INDICATIONS FOR PROCEDURE:  Ms. Calles is a 45 year old female who is a  patient of Dr. Birdie Sons, who has completed our preop bariatric  program.  She is interested in a Roux-en-Y gastric bypass.  Discussed  with her the indications and potential complications of bypass surgery.  Potential complications of gastric bypass surgery include, but are not  limited to, bleeding, infection, bowel leak, blood clots, pulmonary  embolism, and long-term nutritional consequences.   OPERATIVE NOTE:  Patient was placed in a supine position.  She was given  antibiotics at the initiation of the procedure; however, was prepped  with ChloraPrep.  A Foley catheter was placed. The surgical check list  was run.   Her abdomen was sterilely draped, and abdomen accessed through the left  upper quadrant with an 12 mm OptiVu trocar.  I placed 6 additional  trocars.  I placed a 5 mm trocar lateral left subcostal.  I placed a 5  mm trocar subxiphoid for liver retractor.  I placed a 12 mL left mid  abdomen for the camera port.  I placed a right paramedian trocar 12 mL  for right subcostal and an 11 mm to the right of the umbilicus.   Abdominal exploration carried out.  The  right and left lobes of liver  were unremarkable.  Stomach was unremarkable.  The bowels that I could  see were unremarkable.  She did have some adhesions of her omentum along  her right anterior abdominal wall.  I spent 10 minutes taking these down  at the initiation of the case.   I then found the small bowel at the ligament of Treitz.  I counted 40 cm  and divided the small bowel with a firing of 45 mm of the Endo-GIA  stapler.  I divided the mesentery with harmonic scalpel.  I placed a  Penrose drain on the future gastric limb of the small bowel.  I then  counted 100 cm of small bowel and carried out a side-to-side  jejunojejunostomy anastomosis using a 45 white staple load through the  small bowel.  I closed the enterotomy with 2 running 2-0 Vicryl sutures.  I probed this with an alligator and no evidence of any defect.  I then  closed the mesenteric defect with a running 2-0 silk suture.  The  anastomosis  at the jejunojejunostomy seemed to lay flat.  I covered  this with Tisseel.   I then divided the omentum down to the transverse colon to try to free  up any problems with the small bowel going up to the stomach.  The  patient was placed in the Trendelenburg position, placed a Nathanson  retractor on the left lobe of liver and started dissecting around the  stomach.  I tried to create a pouch about 5 cm in length and 3 cm in  width.  I got to the lesser sac along the lesser curvature.  I first  fired a 45 prior Ethicon blue load.   I then fired three firings of the 60 mm Ethicon stapler to reproduce the  greater curvature.   After the stomach had been divided, we did place the Ewald tube to make  sure there was no compromise of the esophagus.  I then over-sewed the  stomach remnant with a running 2-0 Vicryl suture.   I then brought the jejunum antecolic and sewed it to the stomach remnant  with a running 2-0 Vicryl suture.  I placed an enterotomy in the stomach  and the  jejunum and created a gastric jejunostomy with the blue load of  the Ethicon 45 mm stapler.  I tried to create about a 2 to 2.5 cm  opening.  I then closed the gastrojejunostomy with 2 running 2-0 Vicryl  sutures.  I had to place 2 additional Vicryl sutures as single sutures  to buttress this anastomosis.  I then did a running anterior layer of 2-  0 Vicryl suture.   I closed Peterson's defect with a Z-stitch of 2-0 silk suture and was  then irrigated.  Dr. Daphine Deutscher broke scrub and went up and did upper  endoscopy.  He was able to pass the scope down into the stomach.  He saw  the gastrojejunal anastomosis, and  insufflated this with air.  I  clamped off the small bowel and flooded the upper abdomen with saline.  There was no air leak or apparent problem with the anastomosis.  There  was no bleeding.  He will dictate this portion of the procedure.   After completing the upper endoscopy, he removed the endoscope.  I  reinspected both the jejunojejunostomy and the gastrojejunostomy.  I  placed Tisseel over both.  I saw no reason to drain the patient.  I then  removed the trocars in turn.  The skin at each site was closed with 5-0  Vicryl suture, painted with Dermabond, and sterilely dressed.   Sponge and needle count were correct at the end of the case.  The  patient tolerated the procedure well and was transported to the recovery  room in good condition.      Sandria Bales. Ezzard Standing, M.D.  Electronically Signed     DHN/MEDQ  D:  11/02/2008  T:  11/02/2008  Job:  [id number]   cc:   Valetta Mole. Swords, MD  [address] [place]  [place] [address]

## 2010-09-05 NOTE — H&P (Signed)
NAMEHAILA, Annette Brooks NO.:  [id number]   MEDICAL RECORD NO.:  [id number]          PATIENT TYPE:  INP   LOCATION:  1537                         [facility]ACILITY[facility]   PHYSICIAN:  Juanetta Gosling, MDDATE OF BIRTH:  09-15-1965   DATE OF ADMISSION:  11/13/2008  DATE OF DISCHARGE:                              HISTORY & PHYSICAL   CHIEF COMPLAINT:  Epigastric pain, status post gastric bypass.   PRIMARY CARE PHYSICIAN:  Dr. Birdie Sons.   CHIEF COMPLAINT:  Epigastric pain.   HPI:  This a 45 year old female who on November 02, 2008, underwent a  laparoscopic Roux-en-Y gastric bypass in the standard antecolic  antegastric fashion.  She did well and was discharged home  postoperatively without any complications.  She had been doing well  until today.  She began developing sharp epigastric pains that were  radiating directly to her back.  She began taking her Roxicet elixir at  an increased rate with some relief but this worsened without the day and  was not really allowing her to have any relief at that point which  caused her present to the emergency room.  She is having bowel  movements.  She is passing gas.  She was tolerating water during the  day.  This all occurred after she had drank a diet Snapple Tea but I am  unsure if this relation is the cause of her pain.  She has never had  this sort of pain before.   PAST MEDICAL HISTORY:  Significant for morbid obesity.   PAST SURGICAL HISTORY:  Laparoscopic Roux-en-Y gastric bypass, as well  as on multiple breast biopsies and uterine fibroids.   MEDICATIONS:  1. Vitamin B12.  2. Multivitamin.  3. Calcium.  4. Roxicet elixir.   ALLERGIES:  NONE KNOWN.   SOCIAL HISTORY:  She is a nonsmoker and does not drink alcohol.   REVIEW OF SYSTEMS:  Otherwise negative.   PHYSICAL EXAM:  98.4, 68, 20, 125/87.  GENERAL:  She is an obese female in no distress.  NECK:  Supple without adenopathy.  HEART:  Regular rate and  rhythm.  LUNGS:  Clear bilaterally.  ABDOMEN:  Soft, obese, nontender, nondistended.  Her incisions are  clean, dry, and intact.  EXTREMITIES:  Show no edema.   LABORATORY EVALUATION:  Shows a white blood cell count of 7.7 with no  left shift, hemoglobin 11.3, hematocrit 34.1, platelets of 307.  Her  lipase is 19, sodium is 137, potassium 4.4, chloride 106, CO2 of 23,  glucose 79, BUN 10, creatinine 0.74, total bilirubin 1, alkaline  phosphatase 80, AST 59, ALT 91, albumin is 3.6.  Urinalysis shows no  evidence of an infection and her pregnancy test is negative.  She had a  swallow performed on November 03, 2008, immediately postoperatively that was  normal.  She has a CT from today which shows some expected post surgical  change in left upper quadrant.  No extraluminal fluid or any  extravasation of oral contrast or free air is noted.  The bowel was  normal in caliber.  There is  a gallstone present in the gallbladder.   ASSESSMENT:  Epigastric pain, status post laparoscopic Roux-en-Y gastric  bypass.   PLAN:  I am going to admit her.  I am unsure exactly what is causing  pain right now.  This does not really sound like symptomatic  cholelithiasis as a source of her pain.  She is certainly at risk of  having a marginal ulcer although she does not smoke and does not claim  to have been taking any other medications that would put her at higher  risk but I think this is certainly on the differential.  I will begin  treating her for that with a proton pump inhibitor, as well as  sucralfate.  We will plan on checking H. pylori.  We will monitor her  overnight to see if her pain has resolved.  If her pain does not resolve  on its own, then we will discuss with GI possible endoscopy to evaluate  her anastomosis.  Other considerations would be asymptomatic  cholelithiasis which we would address possibly after the ulcer workup.  I will also let one of our bariatric surgeons know that she is here  once  one of them is available.      Juanetta Gosling, MD  Electronically Signed     MCW/MEDQ  D:  11/14/2008  T:  11/14/2008  Job:  [id number]   cc:   Valetta Mole. Swords, MD  [address] [place]  [place] [address]   Sandria Bales. Ezzard Standing, M.D.  [address], [address]  [place]  [place] [address]

## 2010-09-05 NOTE — Discharge Summary (Signed)
NAMELAIBA, FUERTE NO.:  [id number]   MEDICAL RECORD NO.:  [id number]          PATIENT TYPE:  INP   LOCATION:  1534                         FACILITY:  [facility]   PHYSICIAN:  Sandria Bales. Ezzard Standing, M.D.  DATE OF BIRTH:  [birth year]   DATE OF ADMISSION:  11/02/2008  DATE OF DISCHARGE:  11/04/2008                               DISCHARGE SUMMARY   DISCHARGE DIAGNOSES:  1. Morbid obesity with weight 253 and BMI 42.2.  2. History of hypertrophic scars.  3. Gallbladder ultrasound showing gallbladder polyp.  4. Diverticulosis.   OPERATIONS PERFORMED:  The patient underwent a laparoscopic Roux-en-Y  gastric bypass and upper endoscopy on November 02, 2008.   HISTORY OF PRESENT ILLNESS:  Ms. Lorna Strother is a 45 year old white  female who has been through our bariatric program.  She is a patient of  Dr. Birdie Sons.  She is interested in Roux-en-Y gastric bypass.   Her history is significant in that she has had ultrasound which showed  gallbladder polyps but no obvious stones.  She is asymptomatic.  Nothing  will be done about this at the time of surgery.  Secondly, she has had a  colonoscopy with which Dr. Arlyce Dice has seen diverticular disease.   She is otherwise healthy except for her weight.  She has completed our  pre-op medical, nutritional and psychiatric evaluations and now comes  for bypass surgery.   On November 02, 2008, she underwent a laparoscopic Roux-en-Y gastric bypass  and upper endoscopy and did well.   On her first postop day, her white blood count was 12,800 with  hemoglobin 12.5.  She had lower extremity venous Dopplers which were  negative.  She had a normal UGI swallowl.  By the second postoperative  day, she was advanced to a protein drink.  White blood count was 6900.  She was doing well and was ready for discharge.   Her discharge instructions included Roxicet for pain.  She could shower at home in a couple of days.  She could not drive for  a couple of  days.  She can increase her activity over the next week or two.  She has our  bariatric diet.  She is to be followed by the  nutritionist.  She will see me in two to three weeks for followup.   CONDITION ON DISCHARGE:  Good.      Sandria Bales. Ezzard Standing, M.D.  Electronically Signed     DHN/MEDQ  D:  11/24/2008  T:  11/24/2008  Job:  [id number]   cc:   Valetta Mole. Swords, MD  [address] [place]  [place] [address]   Barbette Hair. Arlyce Dice, MD,FACG  [address]  [place]  [place] [address]

## 2010-11-10 ENCOUNTER — Encounter: Payer: Self-pay | Admitting: Internal Medicine

## 2010-11-13 ENCOUNTER — Encounter: Payer: Self-pay | Admitting: Internal Medicine

## 2010-11-13 ENCOUNTER — Ambulatory Visit (INDEPENDENT_AMBULATORY_CARE_PROVIDER_SITE_OTHER): Payer: Medicare HMO | Admitting: Internal Medicine

## 2010-11-13 VITALS — BP 110/70 | Temp 98.2°F | Wt 194.0 lb

## 2010-11-13 DIAGNOSIS — B369 Superficial mycosis, unspecified: Secondary | ICD-10-CM

## 2010-11-13 DIAGNOSIS — E669 Obesity, unspecified: Secondary | ICD-10-CM

## 2010-11-13 MED ORDER — MICONAZOLE NITRATE 2 % EX CREA: TOPICAL_CREAM | Freq: Two times a day (BID) | CUTANEOUS | Status: DC

## 2010-11-13 MED ORDER — BENZOYL PEROXIDE 10 % EX CREA: TOPICAL_CREAM | CUTANEOUS | Status: DC

## 2010-11-13 NOTE — Progress Notes (Signed)
  Subjective:    Patient ID: Annette Brooks, female    DOB: 11-09-1965, 45 y.o.   MRN: [id number]  HPI  45 year old patient who is seen within a rash involving the lower abdominal area. She states this has been an intermittent problem since her gastric bypass surgery 2 years ago. She has been using a number of topical medications with some benefit.    Review of Systems  Skin: Positive for rash.       Objective:   Physical Exam  Skin:       Some mild erythema and dry scaly skin in the lower abdominal skin folds and inguinal area          Assessment & Plan:   Intertriginous dermatitis. Will treat with miconazole. Also suggested benzoyl peroxide wash for prevention

## 2010-11-13 NOTE — Patient Instructions (Signed)
Call or return to clinic prn if these symptoms worsen or fail to improve as anticipated.

## 2011-01-10 ENCOUNTER — Ambulatory Visit (INDEPENDENT_AMBULATORY_CARE_PROVIDER_SITE_OTHER): Payer: Medicare HMO | Admitting: Internal Medicine

## 2011-01-10 ENCOUNTER — Encounter: Payer: Self-pay | Admitting: Internal Medicine

## 2011-01-10 DIAGNOSIS — R5383 Other fatigue: Secondary | ICD-10-CM

## 2011-01-10 DIAGNOSIS — E0590 Thyrotoxicosis, unspecified without thyrotoxic crisis or storm: Secondary | ICD-10-CM

## 2011-01-10 DIAGNOSIS — R5381 Other malaise: Secondary | ICD-10-CM

## 2011-01-10 LAB — CBC WITH DIFFERENTIAL/PLATELET
Basophils Absolute: 0 10*3/uL (ref 0.0–0.1)
Eosinophils Absolute: 0.1 10*3/uL (ref 0.0–0.7)
Lymphocytes Relative: 39.3 % (ref 12.0–46.0)
MCHC: 32.6 g/dL (ref 30.0–36.0)
MCV: 94.8 fl (ref 78.0–100.0)
Monocytes Absolute: 0.4 10*3/uL (ref 0.1–1.0)
Neutrophils Relative %: 52.5 % (ref 43.0–77.0)
Platelets: 299 10*3/uL (ref 150.0–400.0)
RDW: 14.2 % (ref 11.5–14.6)

## 2011-01-10 LAB — BASIC METABOLIC PANEL
Calcium: 8.6 mg/dL (ref 8.4–10.5)
Chloride: 105 mEq/L (ref 96–112)
Creatinine, Ser: 0.6 mg/dL (ref 0.4–1.2)
GFR: 114.27 mL/min (ref >60.00)

## 2011-01-10 LAB — VITAMIN B12: Vitamin B-12: 457 pg/mL (ref 211–911)

## 2011-01-10 LAB — SEDIMENTATION RATE: Sed Rate: 29 mm/hr — ABNORMAL HIGH (ref 0–22)

## 2011-01-10 LAB — HEPATIC FUNCTION PANEL
ALT: 12 U/L (ref 0–35)
Bilirubin, Direct: 0 mg/dL (ref 0.0–0.3)
Total Bilirubin: 0.6 mg/dL (ref 0.3–1.2)

## 2011-01-10 MED ORDER — BECLOMETHASONE DIPROPIONATE 40 MCG/ACT IN AERS: 2.0000 | INHALATION_SPRAY | Freq: Two times a day (BID) | RESPIRATORY_TRACT | Status: DC

## 2011-01-10 MED ORDER — TRAZODONE HCL 50 MG PO TABS: ORAL_TABLET | ORAL | Status: DC

## 2011-01-10 NOTE — Assessment & Plan Note (Signed)
Almost certainly related to sleep disturbance. She describes early awakenings I'll obtain routine labs for fatigue (note hx of hyperthyroidism). Will try trazadone for sleep Side effects discussed

## 2011-01-10 NOTE — Progress Notes (Signed)
  Subjective:    Patient ID: Annette Brooks, female    DOB: April 26, 1965, 45 y.o.   MRN: [id number]  HPI  Fatigue---she blames on lack of sleep. She falls asleep easily wakes up early and can't fall back asleep. She works a lot 10-15 hours daily. Her main issue is that she is getting 4-6 hours of sleep per night. When she wakes up after 4 hours of sleep she gets up and starts her usual activities.   She is sleepy during the day. Always fatigued  Past Medical History  Diagnosis Date  . Anxiety state, unspecified 02/16/2009  . ASTHMA 12/11/2006  . Bariatric surgery status 02/16/2009  . COLONIC POLYPS, HX OF 12/11/2006  . GERD 12/11/2006  . HYPERTHYROIDISM 11/02/2009  . OBESITY 05/10/2008  . Acne    Past Surgical History  Procedure Date  . Gastric bypass   . Breast surgery     cysts    reports that she quit smoking about 9 years ago. She has never used smokeless tobacco. She reports that she does not drink alcohol or use illicit drugs. family history is negative for Cancer, and Diabetes, and Hypertension, and Stroke, . No Known Allergies   Review of Systems  patient denies chest pain, shortness of breath, orthopnea. Denies lower extremity edema, abdominal pain, change in appetite, change in bowel movements. Patient denies rashes, musculoskeletal complaints. No other specific complaints in a complete review of systems.      Objective:   Physical Exam   Well-developed well-nourished female in no acute distress. HEENT exam atraumatic, normocephalic, extraocular muscles are intact. Neck is supple. No jugular venous distention no thyromegaly. Chest clear to auscultation without increased work of breathing. Cardiac exam S1 and S2 are regular. Abdominal exam active bowel sounds, soft, nontender, overweight. . Extremities no edema. Neurologic exam she is alert without any motor sensory deficits. Gait is normal.       Assessment & Plan:

## 2011-01-10 NOTE — Assessment & Plan Note (Signed)
Resolved---remove from problem list

## 2011-01-17 ENCOUNTER — Other Ambulatory Visit (INDEPENDENT_AMBULATORY_CARE_PROVIDER_SITE_OTHER): Payer: Managed Care, Other (non HMO)

## 2011-01-17 DIAGNOSIS — D509 Iron deficiency anemia, unspecified: Secondary | ICD-10-CM

## 2011-01-19 LAB — IBC PANEL: Iron: 20 ug/dL — ABNORMAL LOW (ref 42–145)

## 2011-01-25 ENCOUNTER — Other Ambulatory Visit: Payer: Self-pay | Admitting: *Deleted

## 2011-01-25 MED ORDER — FERROUS SULFATE 325 (65 FE) MG PO TABS: 325.0000 mg | ORAL_TABLET | Freq: Two times a day (BID) | ORAL | Status: DC

## 2011-03-29 ENCOUNTER — Ambulatory Visit (INDEPENDENT_AMBULATORY_CARE_PROVIDER_SITE_OTHER): Payer: Managed Care, Other (non HMO) | Admitting: Internal Medicine

## 2011-03-29 ENCOUNTER — Encounter: Payer: Self-pay | Admitting: Internal Medicine

## 2011-03-29 DIAGNOSIS — Z9884 Bariatric surgery status: Secondary | ICD-10-CM

## 2011-03-29 DIAGNOSIS — E669 Obesity, unspecified: Secondary | ICD-10-CM

## 2011-03-29 DIAGNOSIS — J45909 Unspecified asthma, uncomplicated: Secondary | ICD-10-CM

## 2011-03-29 NOTE — Assessment & Plan Note (Signed)
She has gained some weight Discussed need for aggressive weight loss

## 2011-03-29 NOTE — Progress Notes (Signed)
Patient ID: Annette Brooks, female   DOB: 16-Jul-1965, 45 y.o.   MRN: [id number] Patient Active Problem List  Diagnoses  . OBESITY-she has gained some weight  .   Marland Kitchen ASTHMA--tolerating qvar  . GERD---no sxs    Past Medical History  Diagnosis Date  . Anxiety state, unspecified 02/16/2009  . ASTHMA 12/11/2006  . Bariatric surgery status 02/16/2009  . COLONIC POLYPS, HX OF 12/11/2006  . GERD 12/11/2006  . HYPERTHYROIDISM 11/02/2009  . OBESITY 05/10/2008  . Acne     History   Social History  . Marital Status: Single    Spouse Name: N/A    Number of Children: N/A  . Years of Education: N/A   Occupational History  . Not on file.   Social History Main Topics  . Smoking status: Former Smoker    Quit date: 01/09/2002  . Smokeless tobacco: Never Used  . Alcohol Use: No  . Drug Use: No  . Sexually Active: Not on file   Other Topics Concern  . Not on file   Social History Narrative  . No narrative on file    Past Surgical History  Procedure Date  . Gastric bypass   . Breast surgery     cysts    Family History  Problem Relation Age of Onset  . Cancer Neg Hx     colon ca  . Diabetes Neg Hx     family  . Hypertension Neg Hx     family  . Stroke Neg Hx     family    No Known Allergies  Current Outpatient Prescriptions on File Prior to Visit  Medication Sig Dispense Refill  . beclomethasone (QVAR) 40 MCG/ACT inhaler Inhale 2 puffs into the lungs 2 (two) times daily.  1 Inhaler  3  . calcium carbonate (OS-CAL) 600 MG TABS Take 600 mg by mouth daily.        . Digestive Enzymes (PAPAYA ENZYME PO) Take by mouth daily.        . ferrous sulfate 325 (65 FE) MG tablet Take 1 tablet (325 mg total) by mouth 2 (two) times daily.  60 tablet  3  . Multiple Vitamin (MULTIVITAMIN) tablet Take 1 tablet by mouth daily.           patient denies chest pain, shortness of breath, orthopnea. Denies lower extremity edema, abdominal pain, change in appetite, change in bowel movements.  Patient denies rashes, musculoskeletal complaints. No other specific complaints in a complete review of systems.   BP 122/82  Pulse 64  Temp(Src) 98.1 F (36.7 C) (Oral)  Ht 5' 5.5" (1.664 m)  Wt 197 lb (89.359 kg)  BMI 32.28 kg/m2  Well-developed well-nourished female in no acute distress. HEENT exam atraumatic, normocephalic, extraocular muscles are intact. Neck is supple. No jugular venous distention no thyromegaly. Chest clear to auscultation without increased work of breathing. Cardiac exam S1 and S2 are regular. Abdominal exam active bowel sounds, soft, nontender. Extremities no edema. Neurologic exam she is alert without any motor sensory deficits. Gait is normal.

## 2011-03-29 NOTE — Assessment & Plan Note (Signed)
She is using qvar as prescribed

## 2011-03-29 NOTE — Assessment & Plan Note (Signed)
She understands need to lose weight

## 2011-04-02 ENCOUNTER — Telehealth: Payer: Self-pay | Admitting: Internal Medicine

## 2011-04-02 MED ORDER — TRAZODONE HCL 50 MG PO TABS: 50.0000 mg | ORAL_TABLET | Freq: Every day | ORAL | Status: DC

## 2011-04-02 NOTE — Telephone Encounter (Signed)
rx sent in electronically 

## 2011-04-02 NOTE — Telephone Encounter (Signed)
Was in last week and requested a refill on traZODone (DESYREL) 50 MG tablet  Please call in to [facility]

## 2011-07-01 ENCOUNTER — Other Ambulatory Visit: Payer: Self-pay | Admitting: Internal Medicine

## 2011-10-23 ENCOUNTER — Other Ambulatory Visit: Payer: Self-pay | Admitting: Internal Medicine

## 2011-11-05 ENCOUNTER — Encounter: Payer: Self-pay | Admitting: Internal Medicine

## 2011-11-05 ENCOUNTER — Ambulatory Visit (INDEPENDENT_AMBULATORY_CARE_PROVIDER_SITE_OTHER): Payer: Managed Care, Other (non HMO) | Admitting: Internal Medicine

## 2011-11-05 VITALS — BP 122/80 | Temp 98.2°F | Wt 194.0 lb

## 2011-11-05 DIAGNOSIS — F411 Generalized anxiety disorder: Secondary | ICD-10-CM

## 2011-11-05 DIAGNOSIS — H9319 Tinnitus, unspecified ear: Secondary | ICD-10-CM

## 2011-11-05 MED ORDER — SERTRALINE HCL 50 MG PO TABS: 50.0000 mg | ORAL_TABLET | Freq: Every day | ORAL | Status: DC

## 2011-11-05 NOTE — Assessment & Plan Note (Signed)
Trial sertraline 50 mg po qhs Side effects discussed

## 2011-11-05 NOTE — Progress Notes (Signed)
Patient ID: Annette Brooks, female   DOB: [birth year], 46 y.o.   MRN: [id number]  Tinnitus for 3 months-- ? Hearing loss  She admits to mood disorder: agitated, easy to anger  Past Medical History  Diagnosis Date  . Anxiety state, unspecified 02/16/2009  . ASTHMA 12/11/2006  . Bariatric surgery status 02/16/2009  . COLONIC POLYPS, HX OF 12/11/2006  . GERD 12/11/2006  . HYPERTHYROIDISM 11/02/2009  . OBESITY 05/10/2008  . Acne     History   Social History  . Marital Status: Single    Spouse Name: N/A    Number of Children: N/A  . Years of Education: N/A   Occupational History  . Not on file.   Social History Main Topics  . Smoking status: Former Smoker    Quit date: 01/09/2002  . Smokeless tobacco: Never Used  . Alcohol Use: No  . Drug Use: No  . Sexually Active: Not on file   Other Topics Concern  . Not on file   Social History Narrative  . No narrative on file    Past Surgical History  Procedure Date  . Gastric bypass   . Breast surgery     cysts    Family History  Problem Relation Age of Onset  . Cancer Neg Hx     colon ca  . Diabetes Neg Hx     family  . Hypertension Neg Hx     family  . Stroke Neg Hx     family    No Known Allergies  Current Outpatient Prescriptions on File Prior to Visit  Medication Sig Dispense Refill  . beclomethasone (QVAR) 40 MCG/ACT inhaler Inhale 2 puffs into the lungs 2 (two) times daily.  1 Inhaler  3  . calcium carbonate (OS-CAL) 600 MG TABS Take 600 mg by mouth daily.        . Digestive Enzymes (PAPAYA ENZYME PO) Take by mouth daily.        . Multiple Vitamin (MULTIVITAMIN) tablet Take 1 tablet by mouth daily.        . traZODone (DESYREL) 50 MG tablet TAKE 1 TABLET BY MOUTH EVERY DAY  90 tablet  0  . DISCONTD: traZODone (DESYREL) 50 MG tablet TAKE 1/2 TO 1 TABLET AT NIGHT AS NEEDED FOR INSOMNIA  30 tablet  3     patient denies chest pain, shortness of breath, orthopnea. Denies lower extremity edema, abdominal pain,  change in appetite, change in bowel movements. Patient denies rashes, musculoskeletal complaints. No other specific complaints in a complete review of systems.   BP 122/80  Temp 98.2 F (36.8 C) (Oral)  Wt 194 lb (87.998 kg)  Well-developed well-nourished female in no acute distress. HEENT exam atraumatic, normocephalic, extraocular muscles are intact. Neck is supple. No jugular venous distention no thyromegaly. Chest clear to auscultation without increased work of breathing. Cardiac exam S1 and S2 are regular. Abdominal exam active bowel sounds, soft, nontender. Extremities no edema. Neurologic exam she is alert without any motor sensory deficits. Gait is normal.

## 2012-02-15 ENCOUNTER — Ambulatory Visit (INDEPENDENT_AMBULATORY_CARE_PROVIDER_SITE_OTHER): Payer: Managed Care, Other (non HMO)

## 2012-02-15 DIAGNOSIS — Z23 Encounter for immunization: Secondary | ICD-10-CM

## 2012-02-18 ENCOUNTER — Other Ambulatory Visit: Payer: Self-pay | Admitting: Internal Medicine

## 2012-02-21 ENCOUNTER — Encounter: Payer: Self-pay | Admitting: Gastroenterology

## 2012-03-06 ENCOUNTER — Other Ambulatory Visit: Payer: Self-pay | Admitting: Internal Medicine

## 2012-03-24 ENCOUNTER — Ambulatory Visit: Payer: Self-pay | Admitting: Obstetrics and Gynecology

## 2012-04-24 ENCOUNTER — Encounter: Payer: Self-pay | Admitting: Obstetrics and Gynecology

## 2012-04-27 ENCOUNTER — Other Ambulatory Visit: Payer: Self-pay | Admitting: Internal Medicine

## 2012-04-29 ENCOUNTER — Encounter: Payer: Self-pay | Admitting: Obstetrics and Gynecology

## 2012-04-29 ENCOUNTER — Ambulatory Visit (INDEPENDENT_AMBULATORY_CARE_PROVIDER_SITE_OTHER): Payer: Managed Care, Other (non HMO) | Admitting: Obstetrics and Gynecology

## 2012-04-29 VITALS — BP 120/70 | HR 74 | Ht 65.5 in | Wt 198.0 lb

## 2012-04-29 DIAGNOSIS — Z01419 Encounter for gynecological examination (general) (routine) without abnormal findings: Secondary | ICD-10-CM

## 2012-04-29 DIAGNOSIS — Z124 Encounter for screening for malignant neoplasm of cervix: Secondary | ICD-10-CM

## 2012-04-29 DIAGNOSIS — B372 Candidiasis of skin and nail: Secondary | ICD-10-CM

## 2012-04-29 NOTE — Progress Notes (Signed)
Subjective:    Annette Brooks is a 47 y.o. female, G1P1001, who presents for an annual exam. The patient reports a cold x 2 weeks starting with nasal congestion and now in chest.  Has used Mucinex which helped with the cough but continues to have nasal congestion off & on without any purulence.  Patient also complains of mildly itchy rash under breast, panniculus and groin.  Uses Amennen Powder  Menstrual cycle:   LMP: Patient's last menstrual period was 04/18/2012.             Review of Systems Pertinent items are noted in HPI. Denies pelvic pain, urinary tract symptoms, vaginitis symptoms, irregular bleeding, menopausal symptoms, change in bowel habits or rectal bleeding   Objective:    BP 120/70  Pulse 74  Ht 5' 5.5" (1.664 m)  Wt 198 lb (89.812 kg)  BMI 32.45 kg/m2  LMP 04/18/2012   Wt Readings from Last 1 Encounters:  04/29/12 198 lb (89.812 kg)   Body mass index is 32.45 kg/(m^2). General Appearance: Alert, no acute distress HEENT: Grossly normal Neck / Thyroid: Supple, no thyromegaly or cervical adenopathy Lungs: Clear to auscultation bilaterally Back: No CVA tenderness Breast Exam: No masses or nodes.No dimpling, nipple retraction or discharge; under breast, shiny  & pigmented rash that is fawn co Cardiovascular: Regular rate and rhythm.  Gastrointestinal: Soft, non-tender, no masses or organomegaly, under panniuculus with pigmented, dry, shiny skin powder accumulation Pelvic Exam: EGBUS-wnl except moderate powder in crural folds over skin that is dry without any evidence of active rash; vagina-normal rugae, cervix- without lesions or tenderness, uterus appears normal size shape and consistency, adnexae-no masses or tenderness Rectovaginal: no masses and normal sphincter tone Lymphatic Exam: Non-palpable nodes in neck, clavicular,  axillary, or inguinal regions  Skin: no rashes or abnormalities Extremities: no clubbing cyanosis or edema  Neurologic: grossly  normal Psychiatric: Alert and oriented     Assessment:   Routine GYN Exam Intertrigo   Plan:    PAP sent  Zeasorb Powder under breast, panniculus  and in crural folds prn  RTO 1 year or prn  Sacoya Mcgourty,ELMIRAPA-C

## 2012-04-29 NOTE — Progress Notes (Signed)
Regular Periods: yes Mammogram: yes  Monthly Breast Ex.: no Exercise: yes  Tetanus < 10 years: yes Seatbelts: yes  NI. Bladder Functn.: yes Abuse at home: no  Daily BM's: yes Stressful Work: yes  Healthy Diet: yes Sigmoid-Colonoscopy: yes; x4 years;due next year  diverticulosis  Calcium: yes Medical problems this year: skin rashes   LAST PAP:2/12  Contraception: btl  Mammogram:  Long time  PCP: DR. SWORDS  PMH: NO CHANGE  FMH: NO CHANGE  Last Bone Scan: NO     PT IS SINGLE.

## 2012-04-29 NOTE — Patient Instructions (Signed)
Zeasorb Powder

## 2012-04-30 LAB — PAP IG W/ RFLX HPV ASCU

## 2012-05-05 ENCOUNTER — Ambulatory Visit (INDEPENDENT_AMBULATORY_CARE_PROVIDER_SITE_OTHER): Payer: Managed Care, Other (non HMO) | Admitting: Internal Medicine

## 2012-05-05 ENCOUNTER — Encounter: Payer: Self-pay | Admitting: Internal Medicine

## 2012-05-05 VITALS — BP 102/70 | HR 76 | Temp 97.5°F | Resp 18 | Wt 196.0 lb

## 2012-05-05 DIAGNOSIS — L0591 Pilonidal cyst without abscess: Secondary | ICD-10-CM

## 2012-05-05 DIAGNOSIS — L0592 Pilonidal sinus without abscess: Secondary | ICD-10-CM

## 2012-05-05 MED ORDER — CEPHALEXIN 500 MG PO CAPS: 500.0000 mg | ORAL_CAPSULE | Freq: Four times a day (QID) | ORAL | Status: DC

## 2012-05-05 MED ORDER — TRAZODONE HCL 50 MG PO TABS: 50.0000 mg | ORAL_TABLET | Freq: Every day | ORAL | Status: DC

## 2012-05-05 NOTE — Progress Notes (Signed)
Subjective:    Patient ID: Annette Brooks, female    DOB: 1965-11-22, 47 y.o.   MRN: [id number]  HPI 47 year old patient who states that she suffered a fracture to the coccyx while in college. She states she required 2 operations for a pilonidal cyst that drained following this fracture. She states that over the years she has had intermittent drainage from this area and that usually responds to antibiotic therapy. She states that his surgeon is run 78 and 1987. For the past 2 months she describes the drainage in the coccyx region described as foul smelling and low volume. There is been little pain no fever or constitutional complaints.  Past Medical History  Diagnosis Date  . Anxiety state, unspecified 02/16/2009  . ASTHMA 12/11/2006  . Bariatric surgery status 02/16/2009  . COLONIC POLYPS, HX OF 12/11/2006  . GERD 12/11/2006  . OBESITY 05/10/2008  . Acne   . HYPERTHYROIDISM 11/02/2009  . Hidradenitis suppurativa     History   Social History  . Marital Status: Single    Spouse Name: N/A    Number of Children: N/A  . Years of Education: N/A   Occupational History  . Not on file.   Social History Main Topics  . Smoking status: Former Smoker    Quit date: 01/09/2002  . Smokeless tobacco: Never Used  . Alcohol Use: No  . Drug Use: No  . Sexually Active: Yes    Birth Control/ Protection: Surgical     Comment: BTL    Other Topics Concern  . Not on file   Social History Narrative  . No narrative on file    Past Surgical History  Procedure Date  . Gastric bypass   . Breast surgery     cysts  . Tubal ligation   . Myomectomy   . Pilonidal cyst excision     Family History  Problem Relation Age of Onset  . Cancer Neg Hx     colon ca  . Stroke Neg Hx     family  . Hypertension Father   . Diabetes Father   . Hypertension Mother   . Crohn's disease Sister   . Diabetes Sister     No Known Allergies  Current Outpatient Prescriptions on File Prior to Visit    Medication Sig Dispense Refill  . B Complex Vitamins (B-COMPLEX/B-12 PO) Take by mouth.      . calcium carbonate (OS-CAL) 600 MG TABS Take 600 mg by mouth daily.        . Digestive Enzymes (PAPAYA ENZYME PO) Take by mouth daily.        . Multiple Vitamin (MULTIVITAMIN) tablet Take 1 tablet by mouth daily.        Marland Kitchen omeprazole (PRILOSEC) 40 MG capsule Take 40 mg by mouth daily.      . Potassium (POTASSIMIN PO) Take by mouth.      Marland Kitchen QVAR 40 MCG/ACT inhaler INHALE 2 PUFFS 2 TIMES DAILY.  8.7 g  0  . sertraline (ZOLOFT) 50 MG tablet Take 1 tablet (50 mg total) by mouth daily.  90 tablet  3  . traZODone (DESYREL) 50 MG tablet Take 1 tablet (50 mg total) by mouth at bedtime.  90 tablet  3    BP 102/70  Pulse 76  Temp 97.5 F (36.4 C) (Oral)  Resp 18  Wt 196 lb (88.905 kg)  SpO2 96%  LMP 04/18/2012      Review of Systems  Constitutional: Negative.   HENT:  Negative for hearing loss, congestion, sore throat, rhinorrhea, dental problem, sinus pressure and tinnitus.   Eyes: Negative for pain, discharge and visual disturbance.  Respiratory: Negative for cough and shortness of breath.   Cardiovascular: Negative for chest pain, palpitations and leg swelling.  Gastrointestinal: Negative for nausea, vomiting, abdominal pain, diarrhea, constipation, blood in stool and abdominal distention.  Genitourinary: Negative for dysuria, urgency, frequency, hematuria, flank pain, vaginal bleeding, vaginal discharge, difficulty urinating, vaginal pain and pelvic pain.  Musculoskeletal: Negative for joint swelling, arthralgias and gait problem.  Skin: Positive for wound. Negative for rash.  Neurological: Negative for dizziness, syncope, speech difficulty, weakness, numbness and headaches.  Hematological: Negative for adenopathy.  Psychiatric/Behavioral: Negative for behavioral problems, dysphoric mood and agitation. The patient is not nervous/anxious.        Objective:   Physical Exam  Constitutional:  She appears well-developed and well-nourished. No distress.  Skin:       A surgical incision was noted in the superior aspect of the inner gluteal area near the coccyx. A very small erosion was noted involving the superior aspect of the scar. No obvious drainage was appreciated. No inflammatory signs noted          Assessment & Plan:    Small pilonidal fistula.  Patient states that this usually responds to Abx therapy; will place on keflex for 10 days and observe; may need gen surgical referral

## 2012-05-05 NOTE — Patient Instructions (Addendum)
Take your antibiotic as prescribed until ALL of it is gone, but stop if you develop a rash, swelling, or any side effects of the medication.  Contact our office as soon as possible if  there are side effects of the medication.  Call or return to clinic prn if these symptoms worsen or fail to improve as anticipated. Hypoglycemia (Low Blood Sugar) Hypoglycemia is when the glucose (sugar) in your blood is too low. Hypoglycemia can happen for many reasons. It can happen to people with or without diabetes. Hypoglycemia can develop quickly and can be a medical emergency.   CAUSES   Having hypoglycemia does not mean that you will develop diabetes. Different causes include:  Missed or delayed meals or not enough carbohydrates eaten.   Medication overdose. This could be by accident or deliberate. If by accident, your medication may need to be adjusted or changed.   Exercise or increased activity without adjustments in carbohydrates or medications.   A nerve disorder that affects body functions like your heart rate, blood pressure and digestion (autonomic neuropathy).   A condition where the stomach muscles do not function properly (gastroparesis). Therefore, medications may not absorb properly.   The inability to recognize the signs of hypoglycemia (hypoglycemic unawareness).   Absorption of insulin  may be altered.   Alcohol consumption.   Pregnancy/menstrual cycles/postpartum. This may be due to hormones.   Certain kinds of tumors. This is very rare.  SYMPTOMS    Sweating.   Hunger.   Dizziness.   Blurred vision.   Drowsiness.   Weakness.   Headache.   Rapid heart beat.   Shakiness.   Nervousness.  DIAGNOSIS  Diagnosis is made by monitoring blood glucose in one or all of the following ways:  Fingerstick blood glucose monitoring.   Laboratory results.  TREATMENT   If you think your blood glucose is low:  Check your blood glucose, if possible. If it is less than 70  mg/dl, take one of the following:   3-4 glucose tablets.    cup juice (prefer clear like apple).    cup "regular" soda pop.   1 cup milk.   -1 tube of glucose gel.   5-6 hard candies.   Do not over treat because your blood glucose (sugar) will only go too high.   Wait 15 minutes and recheck your blood glucose. If it is still less than 70 mg/dl (or below your target range), repeat treatment.   Eat a snack if it is more than one hour until your next meal.  Sometimes, your blood glucose may go so low that you are unable to treat yourself. You may need someone to help you. You may even pass out or be unable to swallow. This may require you to get an injection of glucagon, which raises the blood glucose. HOME CARE INSTRUCTIONS  Check blood glucose as recommended by your caregiver.   Take medication as prescribed by your caregiver.   Follow your meal plan. Do not skip meals. Eat on time.   If you are going to drink alcohol, drink it only with meals.   Check your blood glucose before driving.   Check your blood glucose before and after exercise. If you exercise longer or different than usual, be sure to check blood glucose more frequently.   Always carry treatment with you. Glucose tablets are the easiest to carry.   Always wear medical alert jewelry or carry some form of identification that states that you have diabetes.  This will alert people that you have diabetes. If you have hypoglycemia, they will have a better idea on what to do.  SEEK MEDICAL CARE IF:    You are having problems keeping your blood sugar at target range.   You are having frequent episodes of hypoglycemia.   You feel you might be having side effects from your medicines.   You have symptoms of an illness that is not improving after 3-4 days.   You notice a change in vision or a new problem with your vision.  SEEK IMMEDIATE MEDICAL CARE IF:    You are a family member or friend of a person whose blood  glucose goes below 70 mg/dl and is accompanied by:   Confusion.   A change in mental status.   The inability to swallow.   Passing out.  Document Released: 04/09/2005 Document Revised: 07/02/2011 Document Reviewed: 08/06/2011 [facility] Patient Information 2013 [place], [place].

## 2012-05-20 ENCOUNTER — Other Ambulatory Visit: Payer: Self-pay | Admitting: *Deleted

## 2012-05-20 MED ORDER — BECLOMETHASONE DIPROPIONATE 40 MCG/ACT IN AERS: 2.0000 | INHALATION_SPRAY | Freq: Two times a day (BID) | RESPIRATORY_TRACT | Status: DC

## 2012-05-27 ENCOUNTER — Ambulatory Visit (INDEPENDENT_AMBULATORY_CARE_PROVIDER_SITE_OTHER): Payer: Managed Care, Other (non HMO) | Admitting: Family Medicine

## 2012-05-27 ENCOUNTER — Encounter: Payer: Self-pay | Admitting: Family Medicine

## 2012-05-27 VITALS — BP 120/76 | HR 56 | Temp 97.4°F | Resp 18 | Wt 194.0 lb

## 2012-05-27 DIAGNOSIS — M791 Myalgia, unspecified site: Secondary | ICD-10-CM

## 2012-05-27 DIAGNOSIS — IMO0001 Reserved for inherently not codable concepts without codable children: Secondary | ICD-10-CM

## 2012-05-27 MED ORDER — TRAMADOL HCL 50 MG PO TABS: ORAL_TABLET | ORAL | Status: DC

## 2012-05-27 NOTE — Progress Notes (Signed)
Chief Complaint  Patient presents with  . muscle aches whole body    moved alot of furniture over the weekend    HPI:  47 yo pt of Dr. Cato Mulligan with hx of anxiety here for muscle soreness: -started: yesterday evening after moving, was lifting a lot and up and down stairs with furniture -symptoms: now with calve, shoulder and hand soreness -worse with/better with: from moving furniture and boxes -denies: fevers, weakness, chills -hx of stomach ulcer so can't take NSAIDs and tylenol is not resolving the pain  ROS: See pertinent positives and negatives per HPI.  Past Medical History  Diagnosis Date  . Anxiety state, unspecified 02/16/2009  . ASTHMA 12/11/2006  . Bariatric surgery status 02/16/2009  . COLONIC POLYPS, HX OF 12/11/2006  . GERD 12/11/2006  . OBESITY 05/10/2008  . Acne   . HYPERTHYROIDISM 11/02/2009  . Hidradenitis suppurativa     Family History  Problem Relation Age of Onset  . Cancer Neg Hx     colon ca  . Stroke Neg Hx     family  . Hypertension Father   . Diabetes Father   . Hypertension Mother   . Crohn's disease Sister   . Diabetes Sister     History   Social History  . Marital Status: Single    Spouse Name: N/A    Number of Children: N/A  . Years of Education: N/A   Social History Main Topics  . Smoking status: Former Smoker    Quit date: 01/09/2002  . Smokeless tobacco: Never Used  . Alcohol Use: No  . Drug Use: No  . Sexually Active: Yes    Birth Control/ Protection: Surgical     Comment: BTL    Other Topics Concern  . None   Social History Narrative  . None    Current outpatient prescriptions:B Complex Vitamins (B-COMPLEX/B-12 PO), Take by mouth., Disp: , Rfl: ;  beclomethasone (QVAR) 40 MCG/ACT inhaler, Inhale 2 puffs into the lungs 2 (two) times daily., Disp: 3 Inhaler, Rfl: 1;  calcium carbonate (OS-CAL) 600 MG TABS, Take 600 mg by mouth daily.  , Disp: , Rfl: ;  Digestive Enzymes (PAPAYA ENZYME PO), Take by mouth daily.  , Disp: ,  Rfl:  Multiple Vitamin (MULTIVITAMIN) tablet, Take 1 tablet by mouth daily.  , Disp: , Rfl: ;  omeprazole (PRILOSEC) 40 MG capsule, Take 40 mg by mouth daily., Disp: , Rfl: ;  Potassium (POTASSIMIN PO), Take by mouth., Disp: , Rfl: ;  sertraline (ZOLOFT) 50 MG tablet, Take 1 tablet (50 mg total) by mouth daily., Disp: 90 tablet, Rfl: 3 traMADol (ULTRAM) 50 MG tablet, Use 1/2 to 1 tablet every 8 hours only as needed for severe pain, Disp: 10 tablet, Rfl: 0;  traZODone (DESYREL) 50 MG tablet, Take 1 tablet (50 mg total) by mouth at bedtime., Disp: 90 tablet, Rfl: 3  EXAM:  Filed Vitals:   05/27/12 1019  BP: 120/76  Pulse: 56  Temp: 97.4 F (36.3 C)  Resp: 18    There is no height on file to calculate BMI.  GENERAL: vitals reviewed and listed above, alert, oriented, appears well hydrated and in no acute distress  HEENT: atraumatic, conjunttiva clear, no obvious abnormalities on inspection of external nose and ears  NECK: no obvious masses on inspection  LUNGS: clear to auscultation bilaterally, no wheezes, rales or rhonchi, good air movement  CV: HRRR, no peripheral edema  MS: moves all extremities without noticeable abnormality -gait normal -difuse mild  muscle TTP in legs and arms and back -no swelling, jt effusions, normal strength and sensation throughout -NV intact  PSYCH: pleasant and cooperative, no obvious depression or anxiety  ASSESSMENT AND PLAN:  Discussed the following assessment and plan:  1. Muscle soreness  traMADol (ULTRAM) 50 MG tablet   -muscle soreness after moving in pt that can not take nsaids -advised per below -Patient advised to return or notify a doctor immediately if symptoms worsen or persist or new concerns arise.  Patient Instructions  -would advise Tylenol, heat and topical creams with menthol or capsaicin  -ultram only if needed if pain severe  -keep active with gentle activities and drink plenty of water  -follow up if worsening or not  better in 2-3 weeks     Tavi Gaughran R.

## 2012-05-27 NOTE — Patient Instructions (Addendum)
-  would advise Tylenol, heat and topical creams with menthol or capsaicin  -ultram only if needed if pain severe  -keep active with gentle activities and drink plenty of water  -follow up if worsening or not better in 2-3 weeks

## 2012-07-10 ENCOUNTER — Ambulatory Visit (INDEPENDENT_AMBULATORY_CARE_PROVIDER_SITE_OTHER): Payer: Managed Care, Other (non HMO) | Admitting: Surgery

## 2012-07-10 ENCOUNTER — Encounter (INDEPENDENT_AMBULATORY_CARE_PROVIDER_SITE_OTHER): Payer: Self-pay | Admitting: Surgery

## 2012-07-10 VITALS — BP 114/82 | HR 67 | Temp 97.6°F | Resp 16 | Ht 65.0 in | Wt 197.2 lb

## 2012-07-10 DIAGNOSIS — Z9884 Bariatric surgery status: Secondary | ICD-10-CM | POA: Insufficient documentation

## 2012-07-10 NOTE — Progress Notes (Signed)
[facility]  Annette Kin, MD,  FACS [address],  [address] [place], [place]    [address] Phone:  [phone] FAX:  [phone]   Re:   Annette Brooks DOB:   Nov 13, 1965 MRN:   [id number]  ASSESSMENT AND PLAN: 1.  RYGB - 11/02/2008 - Annette Brooks  Pre op weight - 253, BMI - 42.2  She has gained some weight back.  She was clearly doing better exercising when her daughter was living with her and this may some of the cause of her weight gain.  Her daughter has moved back in with her and she thinks that she will get out more and move.    I discussed with her about follow up and that we like to see our patients about once a year - but she has had a lot of things pulling her different directions, that is the excuse that she gives for not following up with [place].  She knows I want to see her at least once a year.  It will depend on endo findings as to when I next see her.  2.  History of marginal ulcer.  She probably still has an ulcer.  Last endoscopy by Annette Brooks 12/28/2008.  I think that with continued GI symptoms and it has been 3 1/2 years since her last endo, we need to update this. 3.  Diverticulosis on colonoscopy by Annette Brooks - 2007 4.  Trouble with teeth  Has had caps put on a lot of her teeth. 5.  Trouble with hair  HISTORY OF PRESENT ILLNESS: Chief Complaint  Patient presents with  . Bariatric Follow Up    LTF RNY   Annette Brooks is a 47 y.o. (DOB: 09-13-1965) AA  female who is a patient of Annette Petit, MD and comes to me today for follow up of a RYGB.  I last saw her 03/03/2009, which was only about 6 months after surgery.  She had a known marginal ulcer on upper endoscopy, 01/17/2009, but has had no follow up.  I spent over 20 minutes talking to the patient about followup for bariatric surgery. She has had multiple issues since I last saw her. She has had trouble with her teeth and sees a dentist, Annette Brooks.  She has had get caps on many of her  teeth. She's had trouble with her hair, but this is getting better.  She takes omeprazole 40 mg twice a day, but sometimes forgets to take it. So she is taken omeprazole 4 or 5 times a week.  She is not take Carafate, because she said did not help. She quit smoking greater than 10 years ago and she is not taking NSAIDs. She noticed eating white food (rice/potatoes) get stuck in her pouch and she can't vomit. She was happy with her weight loss incision got down to 160's.  But she is exercising less now and may be not following this close attention to her diet and she is gained about 30 pounds.  She also talked about her work.  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  [employer] has dropped her disability coverage.  And next year, they are going to drop insurance, so their employees will have to go through [employer].  We also talked about her daughter, who is a Theatre stage manager at [employer].  She is to graduate in 2015.  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  Current  Outpatient Prescriptions  Medication Sig Dispense Refill  . B Complex Vitamins (B-COMPLEX/B-12 PO) Take by mouth.      . beclomethasone (QVAR) 40 MCG/ACT inhaler Inhale 2 puffs into the lungs 2 (two) times daily.  3 Inhaler  1  . Multiple Vitamin (MULTIVITAMIN) tablet Take 1 tablet by mouth daily.        Marland Kitchen omeprazole (PRILOSEC) 40 MG capsule Take 40 mg by mouth daily.      . Potassium (POTASSIMIN PO) Take by mouth.      . sertraline (ZOLOFT) 50 MG tablet Take 1 tablet (50 mg total) by mouth daily.  90 tablet  3  . traZODone (DESYREL) 50 MG tablet Take 1 tablet (50 mg total) by mouth at bedtime.  90 tablet  3   No current facility-administered medications for this visit.   Social History: Works with Community education officer (they dropped her disability insurance!) Her daughter is a Consulting civil engineer at [employer] in nursing to finish 2015.  PHYSICAL EXAM: BP 114/82  Pulse 67  Temp(Src) 97.6 F (36.4 C) (Temporal)  Resp 16  Ht 5'  5" (1.651 m)  Wt 197 lb 3.2 oz (89.449 kg)  BMI 32.82 kg/m2  SpO2 98%  General: AA F who is alert and generally healthy appearing.  HEENT: Normal. Pupils equal.  Neck: Supple. No mass.  No thyroid mass.   Lymph Nodes:  No supraclavicular or cervical nodes. Lungs: Clear to auscultation and symmetric breath sounds. Heart:  RRR. No murmur or rub. Abdomen: Soft. No mass.  No hernia. Normal bowel sounds.  Hypertrophic scar at base of sternum bothers her the most.  She has tried Mederma.  Her weight distribution is more pear shaped.  DATA REVIEWED: Epic notes.   Annette Kin, MD, FACS Office:  [phone]

## 2012-07-15 ENCOUNTER — Other Ambulatory Visit (INDEPENDENT_AMBULATORY_CARE_PROVIDER_SITE_OTHER): Payer: Self-pay

## 2012-07-15 ENCOUNTER — Telehealth (INDEPENDENT_AMBULATORY_CARE_PROVIDER_SITE_OTHER): Payer: Self-pay

## 2012-07-15 NOTE — Telephone Encounter (Signed)
Patient aware of labs that is ordered 2 wks  before appt with DR. [facility]

## 2012-08-22 ENCOUNTER — Encounter: Payer: Self-pay | Admitting: Gastroenterology

## 2012-08-22 ENCOUNTER — Encounter: Payer: Self-pay | Admitting: Obstetrics and Gynecology

## 2012-09-25 ENCOUNTER — Telehealth: Payer: Self-pay | Admitting: Internal Medicine

## 2012-09-25 DIAGNOSIS — F411 Generalized anxiety disorder: Secondary | ICD-10-CM

## 2012-09-25 NOTE — Telephone Encounter (Signed)
Caller: Annette Brooks/Patient; Phone: [phone]; Reason for Call: Pt was instructed to take Sertraline 1 tablet a day but pt increased to 1.5 tab to get relief of night sweats and better focus at work.  Pt will run out sooner and will need refill if this is ok with Dr Cato Mulligan or does she need to be seen beforehand.  Also will need refill on Omeprazole.  [facility] [place]   Please call pt to follow up if ok to continue taking Sertraline 1.5 tab a day.

## 2012-09-28 NOTE — Telephone Encounter (Signed)
Ok.. Can change prescription to 50 mg 1.5 tabs daily #135/3 refills

## 2012-09-29 MED ORDER — SERTRALINE HCL 50 MG PO TABS: 75.0000 mg | ORAL_TABLET | Freq: Every day | ORAL | Status: DC

## 2012-09-29 NOTE — Telephone Encounter (Signed)
rx sent in electronically 

## 2013-02-02 ENCOUNTER — Ambulatory Visit: Payer: Managed Care, Other (non HMO) | Admitting: Internal Medicine

## 2013-02-06 ENCOUNTER — Ambulatory Visit (INDEPENDENT_AMBULATORY_CARE_PROVIDER_SITE_OTHER): Payer: Managed Care, Other (non HMO) | Admitting: Internal Medicine

## 2013-02-06 ENCOUNTER — Encounter: Payer: Self-pay | Admitting: Internal Medicine

## 2013-02-06 ENCOUNTER — Other Ambulatory Visit: Payer: Self-pay | Admitting: *Deleted

## 2013-02-06 VITALS — BP 122/90 | HR 62 | Temp 97.9°F | Resp 20 | Wt 198.0 lb

## 2013-02-06 DIAGNOSIS — K219 Gastro-esophageal reflux disease without esophagitis: Secondary | ICD-10-CM

## 2013-02-06 DIAGNOSIS — Z9884 Bariatric surgery status: Secondary | ICD-10-CM

## 2013-02-06 DIAGNOSIS — E669 Obesity, unspecified: Secondary | ICD-10-CM

## 2013-02-06 DIAGNOSIS — F411 Generalized anxiety disorder: Secondary | ICD-10-CM

## 2013-02-06 MED ORDER — SERTRALINE HCL 50 MG PO TABS: 75.0000 mg | ORAL_TABLET | Freq: Every day | ORAL | Status: DC

## 2013-02-06 MED ORDER — ESOMEPRAZOLE STRONTIUM 49.3 MG PO CPDR: 1.0000 | DELAYED_RELEASE_CAPSULE | Freq: Every day | ORAL | Status: DC

## 2013-02-06 MED ORDER — PANTOPRAZOLE SODIUM 40 MG PO TBEC: 40.0000 mg | DELAYED_RELEASE_TABLET | Freq: Every day | ORAL | Status: DC

## 2013-02-06 NOTE — Progress Notes (Signed)
Subjective:    Patient ID: Annette Brooks, female    DOB: July 19, 1965, 47 y.o.   MRN: [id number]  HPI  47 year old patient who is seen today for followup. 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 She also has a history of gastro-esophageal reflux disease. She has been on Protonix in the past which was quite helpful. Her insurance plan apparently does not cover this medication and she has been using OTC omeprazole with poor results.  Past Medical History  Diagnosis Date  . Anxiety state, unspecified 02/16/2009  . ASTHMA 12/11/2006  . Bariatric surgery status 02/16/2009  . COLONIC POLYPS, HX OF 12/11/2006  . GERD 12/11/2006  . OBESITY 05/10/2008  . Acne   . HYPERTHYROIDISM 11/02/2009  . Hidradenitis suppurativa     History   Social History  . Marital Status: Single    Spouse Name: N/A    Number of Children: N/A  . Years of Education: N/A   Occupational History  . Not on file.   Social History Main Topics  . Smoking status: Former Smoker    Quit date: 01/09/2002  . Smokeless tobacco: Never Used  . Alcohol Use: No  . Drug Use: No  . Sexual Activity: Yes    Birth Control/ Protection: Surgical     Comment: BTL    Other Topics Concern  . Not on file   Social History Narrative  . No narrative on file    Past Surgical History  Procedure Laterality Date  . Gastric bypass    . Breast surgery      cysts  . Tubal ligation    . Myomectomy    . Pilonidal cyst excision      Family History  Problem Relation Age of Onset  . Cancer      colon ca  . Stroke      family  . Hypertension Father   . Diabetes Father   . Hyperlipidemia Father   . Hypertension Mother   . Crohn's disease Sister   . Diabetes Sister     No Known Allergies  Current Outpatient Prescriptions on File Prior to Visit  Medication Sig Dispense Refill  . B Complex Vitamins (B-COMPLEX/B-12 PO) Take by mouth.      . beclomethasone (QVAR) 40 MCG/ACT inhaler Inhale 2  puffs into the lungs 2 (two) times daily.  3 Inhaler  1  . Multiple Vitamin (MULTIVITAMIN) tablet Take 1 tablet by mouth daily.        . Potassium (POTASSIMIN PO) Take by mouth.      . sertraline (ZOLOFT) 50 MG tablet Take 1.5 tablets (75 mg total) by mouth daily.  135 tablet  3  . traZODone (DESYREL) 50 MG tablet Take 1 tablet (50 mg total) by mouth at bedtime.  90 tablet  3   No current facility-administered medications on file prior to visit.    BP 122/90  Pulse 62  Temp(Src) 97.9 F (36.6 C) (Oral)  Resp 20  Wt 198 lb (89.812 kg)  BMI 32.95 kg/m2  SpO2 98%  LMP 01/22/2013        Review of Systems  Constitutional: Negative.   HENT: Negative for congestion, dental problem, hearing loss, rhinorrhea, sinus pressure, sore throat and tinnitus.   Eyes: Negative for pain, discharge and visual disturbance.  Respiratory: Negative for cough and shortness of breath.   Cardiovascular: Negative for chest pain, palpitations and leg swelling.  Gastrointestinal: Positive for abdominal pain. Negative for nausea, vomiting,  diarrhea, constipation, blood in stool and abdominal distention.  Genitourinary: Negative for dysuria, urgency, frequency, hematuria, flank pain, vaginal bleeding, vaginal discharge, difficulty urinating, vaginal pain and pelvic pain.  Musculoskeletal: Negative for arthralgias, gait problem and joint swelling.  Skin: Negative for rash.  Neurological: Negative for dizziness, syncope, speech difficulty, weakness, numbness and headaches.  Hematological: Negative for adenopathy.  Psychiatric/Behavioral: Negative for behavioral problems, dysphoric mood and agitation. The patient is not nervous/anxious.        Objective:   Physical Exam  Constitutional: She is oriented to person, place, and time. She appears well-developed and well-nourished.  HENT:  Head: Normocephalic.  Right Ear: External ear normal.  Left Ear: External ear normal.  Mouth/Throat: Oropharynx is clear  and moist.  Eyes: Conjunctivae and EOM are normal. Pupils are equal, round, and reactive to light.  Neck: Normal range of motion. Neck supple. No thyromegaly present.  Cardiovascular: Normal rate, regular rhythm, normal heart sounds and intact distal pulses.   Pulmonary/Chest: Effort normal and breath sounds normal.  Abdominal: Soft. Bowel sounds are normal. She exhibits no distension and no mass. There is no tenderness. There is no rebound and no guarding.  Musculoskeletal: Normal range of motion.  Lymphadenopathy:    She has no cervical adenopathy.  Neurological: She is alert and oriented to person, place, and time.  Skin: Skin is warm and dry. No rash noted.  Psychiatric: She has a normal mood and affect. Her behavior is normal.          Assessment & Plan:   Symptomatic GERD. We'll intensify her antireflux diet. Was given samples of Nexium which appears to be a preferred medication with her plan. Was also given a prescription for generic Protonix which has proven effectiveness Return here as needed

## 2013-02-06 NOTE — Patient Instructions (Signed)

## 2013-04-02 ENCOUNTER — Telehealth: Payer: Self-pay | Admitting: Internal Medicine

## 2013-04-02 NOTE — Telephone Encounter (Signed)
No samples available, pt would like to know if she could switch to proair cause qvar is too expensive

## 2013-04-02 NOTE — Telephone Encounter (Signed)
Pt needs samples of qvar

## 2013-04-03 NOTE — Telephone Encounter (Signed)
Have her ask her pharmacist which steroid inhaler is the least expensive for her. Proair is not adequate

## 2013-04-03 NOTE — Telephone Encounter (Signed)
Pt is going to stay on qvar, she will get her rx from pharmacy

## 2013-04-23 HISTORY — PX: OTHER SURGICAL HISTORY: SHX169

## 2013-05-15 ENCOUNTER — Encounter: Payer: Self-pay | Admitting: Internal Medicine

## 2013-05-15 ENCOUNTER — Ambulatory Visit (INDEPENDENT_AMBULATORY_CARE_PROVIDER_SITE_OTHER): Payer: Managed Care, Other (non HMO) | Admitting: Internal Medicine

## 2013-05-15 VITALS — BP 120/82 | HR 76 | Temp 98.2°F | Ht 65.0 in | Wt 201.0 lb

## 2013-05-15 DIAGNOSIS — J45909 Unspecified asthma, uncomplicated: Secondary | ICD-10-CM

## 2013-05-15 DIAGNOSIS — E669 Obesity, unspecified: Secondary | ICD-10-CM

## 2013-05-15 DIAGNOSIS — K219 Gastro-esophageal reflux disease without esophagitis: Secondary | ICD-10-CM

## 2013-05-15 DIAGNOSIS — Z0000 Encounter for general adult medical examination without abnormal findings: Secondary | ICD-10-CM

## 2013-05-15 LAB — POCT URINALYSIS DIPSTICK
Bilirubin, UA: NEGATIVE
GLUCOSE UA: NEGATIVE
Ketones, UA: NEGATIVE
LEUKOCYTES UA: NEGATIVE
NITRITE UA: NEGATIVE
Protein, UA: NEGATIVE
RBC UA: NEGATIVE
Spec Grav, UA: 1.025
Urobilinogen, UA: 0.2
pH, UA: 5

## 2013-05-15 LAB — LIPID PANEL
CHOLESTEROL: 169 mg/dL (ref 0–200)
HDL: 67.4 mg/dL (ref >39.00)
LDL CALC: 87 mg/dL (ref 0–99)
Total CHOL/HDL Ratio: 3
Triglycerides: 74 mg/dL (ref 0.0–149.0)
VLDL: 14.8 mg/dL (ref 0.0–40.0)

## 2013-05-15 LAB — CBC WITH DIFFERENTIAL/PLATELET
Basophils Absolute: 0 10*3/uL (ref 0.0–0.1)
Basophils Relative: 0.5 % (ref 0.0–3.0)
EOS ABS: 0 10*3/uL (ref 0.0–0.7)
EOS PCT: 0.8 % (ref 0.0–5.0)
HCT: 31.9 % — ABNORMAL LOW (ref 36.0–46.0)
Hemoglobin: 10.2 g/dL — ABNORMAL LOW (ref 12.0–15.0)
LYMPHS PCT: 33.2 % (ref 12.0–46.0)
Lymphs Abs: 1.8 10*3/uL (ref 0.7–4.0)
MCHC: 32 g/dL (ref 30.0–36.0)
MCV: 80.8 fl (ref 78.0–100.0)
MONO ABS: 0.3 10*3/uL (ref 0.1–1.0)
Monocytes Relative: 5.8 % (ref 3.0–12.0)
NEUTROS PCT: 59.7 % (ref 43.0–77.0)
Neutro Abs: 3.2 10*3/uL (ref 1.4–7.7)
PLATELETS: 385 10*3/uL (ref 150.0–400.0)
RBC: 3.95 Mil/uL (ref 3.87–5.11)
RDW: 15.6 % — ABNORMAL HIGH (ref 11.5–14.6)
WBC: 5.4 10*3/uL (ref 4.5–10.5)

## 2013-05-15 LAB — HEPATIC FUNCTION PANEL
ALT: 11 U/L (ref 0–35)
AST: 15 U/L (ref 0–37)
Albumin: 3.6 g/dL (ref 3.5–5.2)
Alkaline Phosphatase: 71 U/L (ref 39–117)
BILIRUBIN DIRECT: 0 mg/dL (ref 0.0–0.3)
BILIRUBIN TOTAL: 0.4 mg/dL (ref 0.3–1.2)
Total Protein: 7.9 g/dL (ref 6.0–8.3)

## 2013-05-15 LAB — BASIC METABOLIC PANEL
BUN: 8 mg/dL (ref 6–23)
CHLORIDE: 108 meq/L (ref 96–112)
CO2: 25 mEq/L (ref 19–32)
Calcium: 8.9 mg/dL (ref 8.4–10.5)
Creatinine, Ser: 0.7 mg/dL (ref 0.4–1.2)
GFR: 90.22 mL/min (ref >60.00)
Glucose, Bld: 73 mg/dL (ref 70–99)
POTASSIUM: 4.4 meq/L (ref 3.5–5.1)
SODIUM: 138 meq/L (ref 135–145)

## 2013-05-15 LAB — TSH: TSH: 1.01 u[IU]/mL (ref 0.35–5.50)

## 2013-05-15 MED ORDER — ALBUTEROL SULFATE HFA 108 (90 BASE) MCG/ACT IN AERS: 2.0000 | INHALATION_SPRAY | Freq: Four times a day (QID) | RESPIRATORY_TRACT | Status: AC | PRN

## 2013-05-15 MED ORDER — TRAZODONE HCL 50 MG PO TABS: 100.0000 mg | ORAL_TABLET | Freq: Every evening | ORAL | Status: AC | PRN

## 2013-05-15 NOTE — Progress Notes (Signed)
Asthma- no sxs  GERD- no sxs on meds  Anxiety- doing well  Weight- she has gained a few pounds  Past Medical History  Diagnosis Date  . Anxiety state, unspecified 02/16/2009  . ASTHMA 12/11/2006  . Bariatric surgery status 02/16/2009  . COLONIC POLYPS, HX OF 12/11/2006  . GERD 12/11/2006  . OBESITY 05/10/2008  . Acne   . HYPERTHYROIDISM 11/02/2009  . Hidradenitis suppurativa     History   Social History  . Marital Status: Single    Spouse Name: N/A    Number of Children: N/A  . Years of Education: N/A   Occupational History  . Not on file.   Social History Main Topics  . Smoking status: Former Smoker    Quit date: 01/09/2002  . Smokeless tobacco: Never Used  . Alcohol Use: No  . Drug Use: No  . Sexual Activity: Yes    Birth Control/ Protection: Surgical     Comment: BTL    Other Topics Concern  . Not on file   Social History Narrative  . No narrative on file    Past Surgical History  Procedure Laterality Date  . Gastric bypass    . Breast surgery      cysts  . Tubal ligation    . Myomectomy    . Pilonidal cyst excision      Family History  Problem Relation Age of Onset  . Cancer      colon ca  . Stroke      family  . Hypertension Father   . Diabetes Father   . Hyperlipidemia Father   . Hypertension Mother   . Crohn's disease Sister   . Diabetes Sister     No Known Allergies  Current Outpatient Prescriptions on File Prior to Visit  Medication Sig Dispense Refill  . B Complex Vitamins (B-COMPLEX/B-12 PO) Take by mouth.      . beclomethasone (QVAR) 40 MCG/ACT inhaler Inhale 2 puffs into the lungs 2 (two) times daily.  3 Inhaler  1  . Multiple Vitamin (MULTIVITAMIN) tablet Take 1 tablet by mouth daily.        . ORSYTHIA 0.1-20 MG-MCG tablet       . pantoprazole (PROTONIX) 40 MG tablet Take 1 tablet (40 mg total) by mouth daily.  30 tablet  3  . sertraline (ZOLOFT) 50 MG tablet Take 1.5 tablets (75 mg total) by mouth daily.  135 tablet  1   No  current facility-administered medications on file prior to visit.     patient denies chest pain, shortness of breath, orthopnea. Denies lower extremity edema, abdominal pain, change in appetite, change in bowel movements. Patient denies rashes, musculoskeletal complaints. No other specific complaints in a complete review of systems.   BP 120/82  Pulse 76  Temp(Src) 98.2 F (36.8 C) (Oral)  Ht 5\' 5"  (1.651 m)  Wt 201 lb (91.173 kg)  BMI 33.45 kg/m2  Well-developed well-nourished female in no acute distress. HEENT exam atraumatic, normocephalic, extraocular muscles are intact. Neck is supple. No jugular venous distention no thyromegaly. Chest clear to auscultation without increased work of breathing. Cardiac exam S1 and S2 are regular. Abdominal exam active bowel sounds, soft, nontender. Extremities no edema. Neurologic exam she is alert without any motor sensory deficits. Gait is normal.

## 2013-05-15 NOTE — Progress Notes (Signed)
Pre visit review using our clinic review tool, if applicable. No additional management support is needed unless otherwise documented below in the visit note. 

## 2013-05-17 NOTE — Assessment & Plan Note (Signed)
No sxs Will give albuterol prn

## 2013-05-17 NOTE — Assessment & Plan Note (Signed)
Check cbc yearly Continue same meds

## 2013-05-26 ENCOUNTER — Other Ambulatory Visit (INDEPENDENT_AMBULATORY_CARE_PROVIDER_SITE_OTHER): Payer: Managed Care, Other (non HMO)

## 2013-05-26 DIAGNOSIS — D519 Vitamin B12 deficiency anemia, unspecified: Secondary | ICD-10-CM

## 2013-05-26 DIAGNOSIS — D518 Other vitamin B12 deficiency anemias: Secondary | ICD-10-CM

## 2013-05-26 LAB — FERRITIN: Ferritin: 7 ng/mL — ABNORMAL LOW (ref 10.0–291.0)

## 2013-05-26 LAB — VITAMIN B12: Vitamin B-12: 299 pg/mL (ref 211–911)

## 2013-06-27 ENCOUNTER — Other Ambulatory Visit: Payer: Self-pay | Admitting: Internal Medicine

## 2013-07-16 ENCOUNTER — Ambulatory Visit (INDEPENDENT_AMBULATORY_CARE_PROVIDER_SITE_OTHER): Payer: Managed Care, Other (non HMO) | Admitting: Surgery

## 2013-07-16 VITALS — BP 130/76 | HR 61 | Temp 97.5°F | Resp 18 | Ht 65.5 in | Wt 203.6 lb

## 2013-07-16 DIAGNOSIS — Z9884 Bariatric surgery status: Secondary | ICD-10-CM

## 2013-07-16 NOTE — Progress Notes (Addendum)
[place]  Alphonsa Overall, MD,  [address]  [place], [place]    [place] Phone:  [phone] FAX:  [phone]   Re:   Annette Brooks DOB:   [birth year] MRN:   [id number]  ASSESSMENT AND PLAN: 1.  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 Tiger Spieker  Pre op weight - 253, BMI - 42.2  Doing well, except for "constant heart burn".  Plan:  She is to get a colonoscopy by Dr. [name] this year and she will contact Dr. Kelby Fam office about doing a upper endo at the same time..    I will see her back in one year.   2.  History of marginal ulcer.    Last endoscopy by D. Lucia Gaskins 01/07/2009.  She had not seen me back until March, 2014.  She now has a good follow up pattern.  I think with her continued GI symptoms, repeat upper endoscopy makes sense.  She got a note from Dr. Kelby Fam office about follow up of a colonoscopy.  She can contact his office about doing both procedures.   3.  Diverticulosis on colonoscopy by Dr. [name] - 2009  She has received a card from Dr. [name] about follow up colonoscopy. He could do the upper endo and colonoscopy at the same time. 4.  Trouble with teeth - this is about the same.  Has had caps put on a lot of her teeth. 5.  Trouble with hair - this is better  HISTORY OF PRESENT ILLNESS: Chief Complaint  Patient presents with  . Bariatric Follow Up    RNY   Annette Brooks is a 48 y.o. (DOB: [birth year]) AA  female who is a patient of Chancy Hurter, MD and comes to me today for follow up of a RYGB. Comes by self. I last saw Annette Brooks on 07/10/2012.  She has three issues:  1) Constant heartburn, no matter what she eats, 2) Has a lot of intestinal gas, which she passes, 3) she has to blow her nose when she eats because it is stopped up.  For the heartburn, she gets some relief with Protonix and AlkaSeltzer.  It has been going on for about 6 months.  It seems to be unrelated to whatever food she eats.  She talks about eating soups for  lunch.  She also admits to not exercising.  I have stress that this important for maintaining weight loss. Her labs from 05/15/2013 look good.  We also talked about her daughter, who is a Presenter, broadcasting at [employer].  She is to graduate in December, 2015.  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  Current Outpatient Prescriptions  Medication Sig Dispense Refill  . albuterol (PROVENTIL HFA;VENTOLIN HFA) 108 (90 BASE) MCG/ACT inhaler Inhale 2 puffs into the lungs every 6 (six) hours as needed for wheezing.  1 Inhaler  0  . B Complex Vitamins (B-COMPLEX/B-12 PO) Take by mouth.      . ferrous sulfate (FER-IRON) 75 (15 FE) MG/ML SOLN Take 27 mg by mouth daily.      Lenda Kelp FE 1.5/30 1.5-30 MG-MCG tablet       . Multiple Vitamin (MULTIVITAMIN) tablet Take 1 tablet by mouth daily.        . pantoprazole (PROTONIX) 40 MG tablet TAKE 1 TABLET EVERY DAY  30 tablet  5  . sertraline (ZOLOFT) 50 MG tablet Take 1.5 tablets (75 mg total) by mouth daily.  135 tablet  1  . traZODone (DESYREL) 50 MG tablet Take 2 tablets (100 mg total) by mouth at bedtime as needed for sleep.  180 tablet  3  . beclomethasone (QVAR) 40 MCG/ACT inhaler Inhale 2 puffs into the lungs 2 (two) times daily.  3 Inhaler  1  . ORSYTHIA 0.1-20 MG-MCG tablet        No current facility-administered medications for this visit.   Social History: Unmarried Works with [employer]. She has one daughter.  Her daughter is a Ship broker at [employer] in nursing to finish 2015.  PHYSICAL EXAM: BP 130/76  Pulse 61  Temp(Src) 97.5 F (36.4 C)  Resp 18  Ht 5' 5.5" (1.664 m)  Wt 203 lb 9.6 oz (92.352 kg)  BMI 33.35 kg/m2  General: AA F who is alert and generally healthy appearing.  HEENT: Normal. Pupils equal.  Neck: Supple. No mass.  No thyroid mass.   Lymph Nodes:  No supraclavicular or cervical nodes. Lungs: Clear to auscultation and symmetric breath sounds. Heart:  RRR. No murmur or rub. Abdomen: Soft. No mass.   No hernia. Normal bowel sounds.  Hypertrophic scar at base of sternum bothers her the most.  Her weight distribution is more pear shaped.  DATA REVIEWED: Epic notes.   Alphonsa Overall, MD, [place] Office:  [phone]

## 2013-07-17 ENCOUNTER — Encounter: Payer: Self-pay | Admitting: Gastroenterology

## 2013-07-17 ENCOUNTER — Telehealth (INDEPENDENT_AMBULATORY_CARE_PROVIDER_SITE_OTHER): Payer: Self-pay | Admitting: *Deleted

## 2013-07-17 NOTE — Telephone Encounter (Signed)
Pt called regarding her apt yesterday with Dr. Lucia Gaskins. She said Dr. Lucia Gaskins requested her to have a Endoscopy with Dr. Kelby Fam office.  She has contacted Dr. Kelby Fam office and they are requesting some additional information.  Please give pt a call back.. Thanks!

## 2013-07-17 NOTE — Telephone Encounter (Signed)
Advised patient DR. Lucia Gaskins has copied DR. [name] of the office notes and if any questions on the day of her office visit with Dr. [name] ask them to call the office . Patient verbalized understanding

## 2013-08-18 ENCOUNTER — Other Ambulatory Visit: Payer: Self-pay | Admitting: Internal Medicine

## 2013-09-09 ENCOUNTER — Encounter: Payer: Self-pay | Admitting: Gastroenterology

## 2013-09-09 ENCOUNTER — Ambulatory Visit (INDEPENDENT_AMBULATORY_CARE_PROVIDER_SITE_OTHER): Payer: Managed Care, Other (non HMO) | Admitting: Gastroenterology

## 2013-09-09 VITALS — BP 110/88 | HR 64 | Ht 65.5 in | Wt 200.0 lb

## 2013-09-09 DIAGNOSIS — Z86010 Personal history of colonic polyps: Secondary | ICD-10-CM

## 2013-09-09 DIAGNOSIS — K219 Gastro-esophageal reflux disease without esophagitis: Secondary | ICD-10-CM

## 2013-09-09 DIAGNOSIS — R1310 Dysphagia, unspecified: Secondary | ICD-10-CM | POA: Insufficient documentation

## 2013-09-09 NOTE — Progress Notes (Signed)
_                                                                                                                History of Present Illness: 48 year old Afro-American female status  post bariatric bypass surgery, history of colon polyps, here for evaluation of dysphagia and for followup colonoscopy.  Since her surgery she has had mild dysphagia to solids which has worsened.  She claims she had an ulcer in the past although I do not see any endoscopy report.  She denies odynophagia or pyrosis.  She's on Protonix.  Adenomatous colon polyps were removed in 2005.  Followup exam in 2008 was negative for polyps.  She has no lower GI complaints including abdominal pain, rectal bleeding or change of bowel habits.    Past Medical History  Diagnosis Date  . Anxiety state, unspecified 02/16/2009  . ASTHMA 12/11/2006  . Bariatric surgery status 02/16/2009  . COLONIC POLYPS, HX OF 12/11/2006  . GERD 12/11/2006  . OBESITY 05/10/2008  . Acne   . HYPERTHYROIDISM 11/02/2009  . Hidradenitis suppurativa   . Anemia    Past Surgical History  Procedure Laterality Date  . Gastric bypass    . Breast surgery      cysts  . Tubal ligation    . Myomectomy    . Pilonidal cyst excision     family history includes Cancer in an other family member; Crohn's disease in her sister; Diabetes in her father and sister; Hyperlipidemia in her father; Hypertension in her father and mother; Stroke in an other family member. Current Outpatient Prescriptions  Medication Sig Dispense Refill  . albuterol (PROVENTIL HFA;VENTOLIN HFA) 108 (90 BASE) MCG/ACT inhaler Inhale 2 puffs into the lungs every 6 (six) hours as needed for wheezing.  1 Inhaler  0  . B Complex Vitamins (B-COMPLEX/B-12 PO) Take by mouth.      . beclomethasone (QVAR) 40 MCG/ACT inhaler Inhale 2 puffs into the lungs 2 (two) times daily.  3 Inhaler  1  . ferrous sulfate (FER-IRON) 75 (15 FE) MG/ML SOLN Take 27 mg by mouth daily.      Lenda Kelp FE  1.5/30 1.5-30 MG-MCG tablet       . Multiple Vitamin (MULTIVITAMIN) tablet Take 1 tablet by mouth daily.        . ORSYTHIA 0.1-20 MG-MCG tablet       . pantoprazole (PROTONIX) 40 MG tablet TAKE 1 TABLET EVERY DAY  30 tablet  3  . sertraline (ZOLOFT) 50 MG tablet Take 1.5 tablets (75 mg total) by mouth daily.  135 tablet  1  . traZODone (DESYREL) 50 MG tablet Take 2 tablets (100 mg total) by mouth at bedtime as needed for sleep.  180 tablet  3   No current facility-administered medications for this visit.   Allergies as of 09/09/2013  . (No Known Allergies)    reports that she quit smoking about 11 years ago. She has never used smokeless tobacco. She reports that she does not drink  alcohol or use illicit drugs.     Review of Systems: Pertinent positive and negative review of systems were noted in the above HPI section. All other review of systems were otherwise negative.  Vital signs were reviewed in today's medical record Physical Exam: General: Well developed , well nourished, no acute distress Skin: anicteric Head: Normocephalic and atraumatic Eyes:  sclerae anicteric, EOMI Ears: Normal auditory acuity Mouth: No deformity or lesions Neck: Supple, no masses or thyromegaly Lungs: Clear throughout to auscultation Heart: Regular rate and rhythm; no murmurs, rubs or bruits Abdomen: Soft, non tender and non distended. No masses, hepatosplenomegaly or hernias noted. Normal Bowel sounds Rectal:deferred Musculoskeletal: Symmetrical with no gross deformities  Skin: No lesions on visible extremities Pulses:  Normal pulses noted Extremities: No clubbing, cyanosis, edema or deformities noted Neurological: Alert oriented x 4, grossly nonfocal Cervical Nodes:  No significant cervical adenopathy Inguinal Nodes: No significant inguinal adenopathy Psychological:  Alert and cooperative. Normal mood and affect  See Assessment and Plan under Problem List

## 2013-09-09 NOTE — Patient Instructions (Signed)
You have been scheduled for an endoscopy with propofol. Please follow written instructions given to you at your visit today. If you use inhalers (even only as needed), please bring them with you on the day of your procedure. Your physician has requested that you go to www.startemmi.com and enter the access code given to you at your visit today. This web site gives a general overview about your procedure. However, you should still follow specific instructions given to you by our office regarding your preparation for the procedure.  It has been recommended to you by your physician that you have a(n) Colonoscopy completed. Per your request, we did not schedule the procedure(s) today. Please contact our office at [phone] should you decide to have the procedure completed.

## 2013-09-09 NOTE — Assessment & Plan Note (Signed)
Plan followup colonoscopy 

## 2013-09-09 NOTE — Assessment & Plan Note (Addendum)
Symptoms are well-controlled with Protonix 

## 2013-09-09 NOTE — Assessment & Plan Note (Signed)
Rule out esophageal stricture  Recommendations #1 upper endoscopy with balloon dilation as indicated

## 2013-09-16 ENCOUNTER — Encounter: Payer: Self-pay | Admitting: Gastroenterology

## 2013-10-06 ENCOUNTER — Encounter: Payer: Self-pay | Admitting: Gastroenterology

## 2013-10-06 ENCOUNTER — Ambulatory Visit (AMBULATORY_SURGERY_CENTER): Payer: Managed Care, Other (non HMO) | Admitting: Gastroenterology

## 2013-10-06 ENCOUNTER — Encounter: Payer: Managed Care, Other (non HMO) | Admitting: Gastroenterology

## 2013-10-06 VITALS — BP 131/102 | HR 71 | Temp 98.5°F | Resp 17 | Ht 65.5 in | Wt 200.0 lb

## 2013-10-06 DIAGNOSIS — R1310 Dysphagia, unspecified: Secondary | ICD-10-CM

## 2013-10-06 MED ORDER — SODIUM CHLORIDE 0.9 % IV SOLN: 500.0000 mL | INTRAVENOUS | Status: DC

## 2013-10-06 NOTE — Patient Instructions (Addendum)

## 2013-10-06 NOTE — Progress Notes (Signed)
Report to PACU, RN, vss, BBS= Clear.  

## 2013-10-06 NOTE — Progress Notes (Signed)
Called to room to assist during endoscopic procedure.  Patient ID and intended procedure confirmed with present staff. Received instructions for my participation in the procedure from the performing physician.  

## 2013-10-06 NOTE — Op Note (Signed)
[facility]  [address]. [place], [address]   ENDOSCOPY PROCEDURE REPORT  PATIENT: Annette Brooks, Annette Brooks  MR#: [id number] BIRTHDATE: [birth year] , 47  yrs. old GENDER: Female ENDOSCOPIST: Inda Castle, MD REFERRED BY: PROCEDURE DATE:  10/06/2013 PROCEDURE:  EGD with balloon dilation ASA CLASS:     Class II INDICATIONS:  Dysphagia. MEDICATIONS: MAC sedation, administered by CRNA and propofol (Diprivan) 200mg  IV TOPICAL ANESTHETIC:  DESCRIPTION OF PROCEDURE: After the risks benefits and alternatives of the procedure were thoroughly explained, informed consent was obtained.  The LB [id number] endoscope was introduced through the mouth and advanced to the proximal jejunum. Without limitations.  The instrument was slowly withdrawn as the mucosa was fully examined.      The upper, middle and distal third of the esophagus were carefully inspected and no abnormalities were noted.  The z-line was well seen at the GEJ.  it was a small gastric remnant and a gastrojejunostomy.  Stomach, anastomosis, short afferent and uterine loops were normal.  Retroflexion was not done because of the small size of the gastric pouch.  Because of complaints of dysphagia numbers 18 and 19 mm balloon dilators were inflated for 30 seconds each.  There was mild resistance.          The scope was then withdrawn from the patient and the procedure completed.  COMPLICATIONS: There were no complications. ENDOSCOPIC IMPRESSION: possible early esophageal stricture-status post balloon dilation  RECOMMENDATIONS: Followup for dysphagia as needed REPEAT EXAM:  eSigned:  Inda Castle, MD 10/06/2013 2:30 PM   WC:BJSEG Kendall Flack, MD and Corinna Lines MD

## 2013-10-07 ENCOUNTER — Telehealth: Payer: Self-pay

## 2013-10-07 NOTE — Telephone Encounter (Signed)
  Follow up Call-  Call back number 10/06/2013  Post procedure Call Back phone  # [phone]  Permission to leave phone message Yes     Patient questions:  Do you have a fever, pain , or abdominal swelling? no Pain Score  0 *  Have you tolerated food without any problems? yes  Have you been able to return to your normal activities? yes  Do you have any questions about your discharge instructions: Diet   no Medications  no Follow up visit  no  Do you have questions or concerns about your Care? no  Actions: * If pain score is 4 or above: No action needed, pain <4.

## 2013-10-09 ENCOUNTER — Telehealth: Payer: Self-pay | Admitting: Gastroenterology

## 2013-10-09 MED ORDER — MAGIC MOUTHWASH: 5.0000 mL | Freq: Three times a day (TID) | ORAL | Status: DC

## 2013-10-09 NOTE — Telephone Encounter (Signed)
Annette Brooks pt had EGD with balloon dil for possible early esophageal stricture 10/06/13. Pt called and states that her throat was sore yesterday and today and she also has pain in between her shoulder blades. There are no midlevel appts available today. As doc of the day please advise.

## 2013-10-09 NOTE — Telephone Encounter (Signed)
Spoke with pt and she is aware, script sent to the pharmacy. Pt states she cannot come for xray until next week. States she will call back Monday and let us know if the pain has continued and then decide if she will have xray or not.

## 2013-10-09 NOTE — Telephone Encounter (Signed)
Please obtain CXR today (PA and lat), " s/p esophageal dilation" and start Magic mouthwash 5 cc po tid, swish and swallow, 6 oz, no refill. Call if pain worse.

## 2013-10-12 ENCOUNTER — Ambulatory Visit (INDEPENDENT_AMBULATORY_CARE_PROVIDER_SITE_OTHER): Payer: Managed Care, Other (non HMO) | Admitting: Family

## 2013-10-12 ENCOUNTER — Encounter: Payer: Self-pay | Admitting: Family

## 2013-10-12 VITALS — BP 94/60 | HR 73 | Temp 98.1°F | Wt 205.0 lb

## 2013-10-12 DIAGNOSIS — K2100 Gastro-esophageal reflux disease with esophagitis, without bleeding: Secondary | ICD-10-CM

## 2013-10-12 DIAGNOSIS — R05 Cough: Secondary | ICD-10-CM

## 2013-10-12 DIAGNOSIS — R059 Cough, unspecified: Secondary | ICD-10-CM

## 2013-10-12 MED ORDER — PANTOPRAZOLE SODIUM 40 MG PO TBEC: 40.0000 mg | DELAYED_RELEASE_TABLET | Freq: Two times a day (BID) | ORAL | Status: AC

## 2013-10-12 MED ORDER — HYDROCOD POLST-CHLORPHEN POLST 10-8 MG/5ML PO LQCR: 5.0000 mL | Freq: Two times a day (BID) | ORAL | Status: DC | PRN

## 2013-10-12 NOTE — Progress Notes (Signed)
Pre visit review using our clinic review tool, if applicable. No additional management support is needed unless otherwise documented below in the visit note. 

## 2013-10-12 NOTE — Patient Instructions (Signed)
Cough, Adult  A cough is a reflex that helps clear your throat and airways. It can help heal the body or may be a reaction to an irritated airway. A cough may only last 2 or 3 weeks (acute) or may last more than 8 weeks (chronic).  CAUSES Acute cough:  Viral or bacterial infections. Chronic cough:  Infections.  Allergies.  Asthma.  Post-nasal drip.  Smoking.  Heartburn or acid reflux.  Some medicines.  Chronic lung problems (COPD).  Cancer. SYMPTOMS   Cough.  Fever.  Chest pain.  Increased breathing rate.  High-pitched whistling sound when breathing (wheezing).  Colored mucus that you cough up (sputum). TREATMENT   A bacterial cough may be treated with antibiotic medicine.  A viral cough must run its course and will not respond to antibiotics.  Your caregiver may recommend other treatments if you have a chronic cough. HOME CARE INSTRUCTIONS   Only take over-the-counter or prescription medicines for pain, discomfort, or fever as directed by your caregiver. Use cough suppressants only as directed by your caregiver.  Use a cold steam vaporizer or humidifier in your bedroom or home to help loosen secretions.  Sleep in a semi-upright position if your cough is worse at night.  Rest as needed.  Stop smoking if you smoke. SEEK IMMEDIATE MEDICAL CARE IF:   You have pus in your sputum.  Your cough starts to worsen.  You cannot control your cough with suppressants and are losing sleep.  You begin coughing up blood.  You have difficulty breathing.  You develop pain which is getting worse or is uncontrolled with medicine.  You have a fever. MAKE SURE YOU:   Understand these instructions.  Will watch your condition.  Will get help right away if you are not doing well or get worse. Document Released: 10/06/2010 Document Revised: 07/02/2011 Document Reviewed: 10/06/2010 ExitCare Patient Information 2015 [employer]. This information is not intended  to replace advice given to you by your health care provider. Make sure you discuss any questions you have with your health care provider.  

## 2013-10-12 NOTE — Progress Notes (Signed)
Subjective:    Patient ID: Annette Brooks, female    DOB: [birth year], 48 y.o.   MRN: [id number]  Cough Pertinent negatives include no chills, fever or wheezing.   48 year old [place] American female, nonsmoker with a history of GERD and esophageal strictures is in today with complaints of a cough. She had an endoscopy done 6 days ago and has had complaints of sore throat and a cough since that procedure. The gastroenterologist prescribed Magic mouthwash but has not helped the cough. Cough is productive of clear phlegm, worse at night. Has been taking Mucinex without much relief. Denies any acidic taste in her mouth. Does report heartburn. Takes protonic 40 mg once daily. Has a history of asthma but no wheezing.   Review of Systems  Constitutional: Negative.  Negative for fever and chills.  HENT: Negative.   Respiratory: Positive for cough. Negative for wheezing.   Cardiovascular: Negative.   Gastrointestinal: Negative.  Negative for nausea and vomiting.  Musculoskeletal: Negative.   Skin: Negative.   Allergic/Immunologic: Negative.   Neurological: Negative.   Psychiatric/Behavioral: Negative.    Past Medical History  Diagnosis Date  . Anxiety state, unspecified 02/16/2009  . ASTHMA 12/11/2006  . Bariatric surgery status 02/16/2009  . COLONIC POLYPS, HX OF 12/11/2006  . GERD 12/11/2006  . OBESITY 05/10/2008  . Acne   . HYPERTHYROIDISM 11/02/2009  . Hidradenitis suppurativa   . Anemia     History   Social History  . Marital Status: Single    Spouse Name: N/A    Number of Children: 1  . Years of Education: N/A   Occupational History  . Medical claims [employer]   Social History Main Topics  . Smoking status: Former Smoker    Quit date: 01/09/2002  . Smokeless tobacco: Never Used  . Alcohol Use: No  . Drug Use: No  . Sexual Activity: Yes    Birth Control/ Protection: Surgical     Comment: BTL    Other Topics Concern  . Not on file   Social History Narrative  . No  narrative on file    Past Surgical History  Procedure Laterality Date  . Gastric bypass    . Breast surgery      cysts  . Tubal ligation    . Myomectomy    . Pilonidal cyst excision      Family History  Problem Relation Age of Onset  . Cancer      colon ca  . Stroke      family  . Hypertension Father   . Diabetes Father   . Hyperlipidemia Father   . Hypertension Mother   . Crohn's disease Sister   . Diabetes Sister     No Known Allergies  Current Outpatient Prescriptions on File Prior to Visit  Medication Sig Dispense Refill  . albuterol (PROVENTIL HFA;VENTOLIN HFA) 108 (90 BASE) MCG/ACT inhaler Inhale 2 puffs into the lungs every 6 (six) hours as needed for wheezing.  1 Inhaler  0  . Alum & Mag Hydroxide-Simeth (MAGIC MOUTHWASH) SOLN Take 5 mLs by mouth 3 (three) times daily.  150 mL  0  . B Complex Vitamins (B-COMPLEX/B-12 PO) Take by mouth.      . beclomethasone (QVAR) 40 MCG/ACT inhaler Inhale 2 puffs into the lungs 2 (two) times daily.  3 Inhaler  1  . ferrous sulfate (FER-IRON) 75 (15 FE) MG/ML SOLN Take 27 mg by mouth daily.      . Multiple Vitamin (MULTIVITAMIN) tablet Take 1  tablet by mouth daily.        . ORSYTHIA 0.1-20 MG-MCG tablet       . sertraline (ZOLOFT) 50 MG tablet Take 1.5 tablets (75 mg total) by mouth daily.  135 tablet  1  . traZODone (DESYREL) 50 MG tablet Take 2 tablets (100 mg total) by mouth at bedtime as needed for sleep.  180 tablet  3   No current facility-administered medications on file prior to visit.    BP 94/60  Pulse 73  Temp(Src) 98.1 F (36.7 C) (Oral)  Wt 205 lb (92.987 kg)  SpO2 97%chart    Objective:   Physical Exam  Constitutional: She is oriented to person, place, and time. She appears well-developed and well-nourished.  HENT:  Right Ear: External ear normal.  Left Ear: External ear normal.  Nose: Nose normal.  Mouth/Throat: Oropharynx is clear and moist.  Neck: Normal range of motion. Neck supple.    Cardiovascular: Normal rate, regular rhythm and normal heart sounds.   Pulmonary/Chest: Effort normal and breath sounds normal. She has no wheezes.  Musculoskeletal: Normal range of motion.  Neurological: She is alert and oriented to person, place, and time.  Skin: Skin is warm and dry.  Psychiatric: She has a normal mood and affect.          Assessment & Plan:   Problem List Items Addressed This Visit   GERD - Primary   Relevant Medications      pantoprazole (PROTONIX) EC tablet    Other Visit Diagnoses   Cough          Increased to 40 mg twice a day. Doesn't next twice a day as needed. Warned of drowsiness. Followup with gastroenterology as scheduled.

## 2013-10-31 ENCOUNTER — Other Ambulatory Visit: Payer: Self-pay | Admitting: Internal Medicine

## 2013-10-31 DIAGNOSIS — D509 Iron deficiency anemia, unspecified: Secondary | ICD-10-CM

## 2013-11-24 ENCOUNTER — Encounter: Payer: Self-pay | Admitting: *Deleted

## 2014-01-07 ENCOUNTER — Ambulatory Visit (INDEPENDENT_AMBULATORY_CARE_PROVIDER_SITE_OTHER): Payer: Managed Care, Other (non HMO)

## 2014-01-07 ENCOUNTER — Ambulatory Visit (INDEPENDENT_AMBULATORY_CARE_PROVIDER_SITE_OTHER): Payer: Managed Care, Other (non HMO) | Admitting: Family Medicine

## 2014-01-07 VITALS — BP 124/80 | HR 72 | Temp 98.2°F | Resp 16 | Ht 65.5 in | Wt 205.8 lb

## 2014-01-07 DIAGNOSIS — R05 Cough: Secondary | ICD-10-CM

## 2014-01-07 DIAGNOSIS — R059 Cough, unspecified: Secondary | ICD-10-CM

## 2014-01-07 MED ORDER — AZITHROMYCIN 250 MG PO TABS: ORAL_TABLET | ORAL | Status: DC

## 2014-01-07 NOTE — Progress Notes (Signed)
  Annette Brooks - 48 y.o. female MRN [id number]  Date of birth: 04/22/1966  SUBJECTIVE:  Including CC & ROS.  The patient presents for evaluation of: Chief Complaint  Patient presents with  . Cough    non-productive x1 week. h/o asthma  . Nasal Congestion  . Facial Pain    sinus pressure/pain (maxillary) x1 week    Above symptoms X 7 days, no improvement with worsening non productive cough No fevers, chills, or rigors. Taking Albuterol, Claritin, Tylenol, and 12hour Pseudofed without improvement. No recent sick contacts. Hx of esophageal stricture - this is different than those symptoms   HISTORY: Past Medical, Surgical, Social, and Family History Reviewed & Updated per EMR. Pertinent Historical Findings include: Obesity s/p Roux-en-Y - no current DM, or HTN Reported history of some sinus infections  DATA REVIEWED: 2 View - CXR -  [facility] reading (PRIMARY) by  Dr. Edilia Bo & Dr. Paulla Fore No acute cardiopulmonary pulmonary .    OBJECTIVE FINDINGS:  VS:  HT:5' 5.5" (166.4 cm)   WT:205 lb 12.8 oz (93.35 kg)  BMI:33.8          BP:124/80 mmHg  HR:72bpm  TEMP:98.2 F (36.8 C)(Oral)  RESP:98 %  PHYSICAL EXAM:            GENERAL:  Adult african [place]  female. In no discomfort; no respiratory distress                PSYCH:  alert and appropriate, good insight.      HNEENT:  mmm, no JVD. MMM, no tonsilar hypertrophy, serous effusions bilaterally without TM erythema. Normal nasal mucosa. No frontal/maxillary TTP    CARDIAC:  RRR, S1/S2 heard, no murmur       LUNGS:  Overall good air movement; faint expiratory crackles in bilateral bases; no whezing     EXTREM:  Warm, well perfused.  Moves all 4 extremities spontaneously; no lateralization.    ASSESSMENT: 1. Cough    PLAN: See problem based charting & AVS for additional documentation. Likely viral URI but given duration, hx of asthma, crackles on exam and lack of access to care due to upcoming trip with provide Rx to fill in 3 days if  not improving or if any sx of worsening.  Continue OTC pseudofed, and allergic rhinitis treatment. Not a candidate for NSAIDs due to prior bypass. Meds ordered this encounter  Medications  . azithromycin (ZITHROMAX) 250 MG tablet    Sig: 2 tab po on day #1 then 1 tab po daily until complete    Dispense:  6 tablet    Refill:  0    Orders Placed This Encounter  Procedures  . DG Chest 2 View        Return if symptoms worsen or fail to improve.

## 2014-01-07 NOTE — Patient Instructions (Signed)
Upper Respiratory Infection, Adult An upper respiratory infection (URI) is also known as the common cold. It is often caused by a type of germ (virus). Colds are easily spread (contagious). You can pass it to others by kissing, coughing, sneezing, or drinking out of the same glass. Usually, you get better in 1 or 2 weeks.  HOME CARE   Only take medicine as told by your doctor.  Use a warm mist humidifier or breathe in steam from a hot shower.  Drink enough water and fluids to keep your pee (urine) clear or pale yellow.  Get plenty of rest.  Return to work when your temperature is back to normal or as told by your doctor. You may use a face mask and wash your hands to stop your cold from spreading. GET HELP RIGHT AWAY IF:   After the first few days, you feel you are getting worse.  You have questions about your medicine.  You have chills, shortness of breath, or brown or red spit (mucus).  You have yellow or brown snot (nasal discharge) or pain in the face, especially when you bend forward.  You have a fever, puffy (swollen) neck, pain when you swallow, or white spots in the back of your throat.  You have a bad headache, ear pain, sinus pain, or chest pain.  You have a high-pitched whistling sound when you breathe in and out (wheezing).  You have a lasting cough or cough up blood.  You have sore muscles or a stiff neck. MAKE SURE YOU:   Understand these instructions.  Will watch your condition.  Will get help right away if you are not doing well or get worse. Document Released: 09/26/2007 Document Revised: 07/02/2011 Document Reviewed: 07/15/2013 ExitCare Patient Information 2015 [employer]. This information is not intended to replace advice given to you by your health care provider. Make sure you discuss any questions you have with your health care provider.  

## 2014-02-13 ENCOUNTER — Other Ambulatory Visit: Payer: Self-pay | Admitting: Internal Medicine

## 2014-07-25 ENCOUNTER — Emergency Department (HOSPITAL_COMMUNITY): Payer: Managed Care, Other (non HMO)

## 2014-07-25 ENCOUNTER — Emergency Department (HOSPITAL_COMMUNITY)
Admission: EM | Admit: 2014-07-25 | Discharge: 2014-07-25 | Disposition: A | Discharge status: Home / Self Care | Payer: Managed Care, Other (non HMO) | Attending: Emergency Medicine | Admitting: Emergency Medicine

## 2014-07-25 ENCOUNTER — Encounter (HOSPITAL_COMMUNITY): Payer: Self-pay | Admitting: Emergency Medicine

## 2014-07-25 DIAGNOSIS — Z87891 Personal history of nicotine dependence: Secondary | ICD-10-CM | POA: Insufficient documentation

## 2014-07-25 DIAGNOSIS — K8020 Calculus of gallbladder without cholecystitis without obstruction: Secondary | ICD-10-CM | POA: Diagnosis not present

## 2014-07-25 DIAGNOSIS — Z872 Personal history of diseases of the skin and subcutaneous tissue: Secondary | ICD-10-CM | POA: Insufficient documentation

## 2014-07-25 DIAGNOSIS — Z7951 Long term (current) use of inhaled steroids: Secondary | ICD-10-CM | POA: Diagnosis not present

## 2014-07-25 DIAGNOSIS — R1033 Periumbilical pain: Secondary | ICD-10-CM | POA: Diagnosis present

## 2014-07-25 DIAGNOSIS — J45909 Unspecified asthma, uncomplicated: Secondary | ICD-10-CM | POA: Insufficient documentation

## 2014-07-25 DIAGNOSIS — R109 Unspecified abdominal pain: Secondary | ICD-10-CM

## 2014-07-25 DIAGNOSIS — F419 Anxiety disorder, unspecified: Secondary | ICD-10-CM | POA: Diagnosis not present

## 2014-07-25 DIAGNOSIS — K219 Gastro-esophageal reflux disease without esophagitis: Secondary | ICD-10-CM | POA: Insufficient documentation

## 2014-07-25 DIAGNOSIS — D649 Anemia, unspecified: Secondary | ICD-10-CM | POA: Diagnosis not present

## 2014-07-25 DIAGNOSIS — Z86010 Personal history of colonic polyps: Secondary | ICD-10-CM | POA: Diagnosis not present

## 2014-07-25 DIAGNOSIS — Z9884 Bariatric surgery status: Secondary | ICD-10-CM | POA: Diagnosis not present

## 2014-07-25 DIAGNOSIS — Z8711 Personal history of peptic ulcer disease: Secondary | ICD-10-CM | POA: Insufficient documentation

## 2014-07-25 DIAGNOSIS — Z79899 Other long term (current) drug therapy: Secondary | ICD-10-CM | POA: Insufficient documentation

## 2014-07-25 DIAGNOSIS — Z8639 Personal history of other endocrine, nutritional and metabolic disease: Secondary | ICD-10-CM | POA: Diagnosis not present

## 2014-07-25 LAB — COMPREHENSIVE METABOLIC PANEL
ALT: 13 U/L (ref 0–35)
AST: 22 U/L (ref 0–37)
Albumin: 3.4 g/dL — ABNORMAL LOW (ref 3.5–5.2)
Alkaline Phosphatase: 70 U/L (ref 39–117)
Anion gap: 9 (ref 5–15)
BUN: 9 mg/dL (ref 6–23)
CO2: 20 mmol/L (ref 19–32)
Calcium: 8.4 mg/dL (ref 8.4–10.5)
Chloride: 108 mmol/L (ref 96–112)
Creatinine, Ser: 0.7 mg/dL (ref 0.50–1.10)
GFR calc Af Amer: >90 mL/min (ref >90)
GFR calc non Af Amer: >90 mL/min (ref >90)
Glucose, Bld: 79 mg/dL (ref 70–99)
Potassium: 4.8 mmol/L (ref 3.5–5.1)
Sodium: 137 mmol/L (ref 135–145)
Total Bilirubin: 0.5 mg/dL (ref 0.3–1.2)
Total Protein: 7.6 g/dL (ref 6.0–8.3)

## 2014-07-25 LAB — CBC WITH DIFFERENTIAL/PLATELET
Basophils Absolute: 0 10*3/uL (ref 0.0–0.1)
Basophils Relative: 0 % (ref 0–1)
Eosinophils Absolute: 0.1 10*3/uL (ref 0.0–0.7)
Eosinophils Relative: 1 % (ref 0–5)
HCT: 33 % — ABNORMAL LOW (ref 36.0–46.0)
Hemoglobin: 10.2 g/dL — ABNORMAL LOW (ref 12.0–15.0)
Lymphocytes Relative: 30 % (ref 12–46)
Lymphs Abs: 2.1 10*3/uL (ref 0.7–4.0)
MCH: 26.5 pg (ref 26.0–34.0)
MCHC: 30.9 g/dL (ref 30.0–36.0)
MCV: 85.7 fL (ref 78.0–100.0)
Monocytes Absolute: 0.5 10*3/uL (ref 0.1–1.0)
Monocytes Relative: 7 % (ref 3–12)
Neutro Abs: 4.4 10*3/uL (ref 1.7–7.7)
Neutrophils Relative %: 62 % (ref 43–77)
Platelets: 432 10*3/uL — ABNORMAL HIGH (ref 150–400)
RBC: 3.85 MIL/uL — ABNORMAL LOW (ref 3.87–5.11)
RDW: 15.6 % — ABNORMAL HIGH (ref 11.5–15.5)
WBC: 7.2 10*3/uL (ref 4.0–10.5)

## 2014-07-25 LAB — POC OCCULT BLOOD, ED: Fecal Occult Bld: NEGATIVE

## 2014-07-25 LAB — URINALYSIS, ROUTINE W REFLEX MICROSCOPIC
Glucose, UA: NEGATIVE mg/dL
Hgb urine dipstick: NEGATIVE
Ketones, ur: NEGATIVE mg/dL
Leukocytes, UA: NEGATIVE
Nitrite: NEGATIVE
Protein, ur: NEGATIVE mg/dL
Specific Gravity, Urine: 1.041 — ABNORMAL HIGH (ref 1.005–1.030)
Urobilinogen, UA: 1 mg/dL (ref 0.0–1.0)
pH: 5.5 (ref 5.0–8.0)

## 2014-07-25 LAB — LIPASE, BLOOD: Lipase: 18 U/L (ref 11–59)

## 2014-07-25 MED ORDER — MORPHINE SULFATE 4 MG/ML IJ SOLN
4.0000 mg | Freq: Once | INTRAMUSCULAR | Status: AC
  Administered 2014-07-25: 4 mg via INTRAVENOUS
  Filled 2014-07-25: qty 1

## 2014-07-25 MED ORDER — ONDANSETRON HCL 4 MG/2ML IJ SOLN
4.0000 mg | Freq: Once | INTRAMUSCULAR | Status: AC
  Administered 2014-07-25: 4 mg via INTRAVENOUS
  Filled 2014-07-25: qty 2

## 2014-07-25 MED ORDER — DICYCLOMINE HCL 20 MG PO TABS: 20.0000 mg | ORAL_TABLET | Freq: Two times a day (BID) | ORAL | Status: DC

## 2014-07-25 MED ORDER — PANTOPRAZOLE SODIUM 40 MG IV SOLR
40.0000 mg | Freq: Once | INTRAVENOUS | Status: AC
  Administered 2014-07-25: 40 mg via INTRAVENOUS
  Filled 2014-07-25: qty 40

## 2014-07-25 MED ORDER — SODIUM CHLORIDE 0.9 % IV BOLUS (SEPSIS)
1000.0000 mL | Freq: Once | INTRAVENOUS | Status: AC
  Administered 2014-07-25: 1000 mL via INTRAVENOUS

## 2014-07-25 MED ORDER — ONDANSETRON HCL 4 MG PO TABS: 4.0000 mg | ORAL_TABLET | Freq: Four times a day (QID) | ORAL | Status: DC

## 2014-07-25 NOTE — ED Provider Notes (Signed)
CSN: [id number]     Arrival date & time 07/25/14  1141 History   First MD Initiated Contact with Patient 07/25/14 1156     Chief Complaint  Patient presents with  . Abdominal Pain     (Consider location/radiation/quality/duration/timing/severity/associated sxs/prior Treatment) HPI   49 year old female with history of GERD, esophagitis, prior bariatric surgery, obesity, and anxiety who presents complaining of abdominal pain.  Patient reports intermittent sharp pain to her periumbilical region that radiates to her back ongoing for the past week. Pain is worsened since yesterday. The pain is 7 out of 10. Report noticing dark stools and decreasing bowel movement. Has been taking Tylenol without adequate relief. Reports history of gastric ulcers in the past include currently taking ranitidine and pantoprazole. She denies having fever, chills, chest pain, shortness of breath, productive cough, nausea, vomiting, diarrhea, dysuria, or rash. She still has her gallbladder. She reports being afraid to eat because it worsened the pain.  Past Medical History  Diagnosis Date  . Anxiety state, unspecified 02/16/2009  . ASTHMA 12/11/2006  . Bariatric surgery status 02/16/2009  . COLONIC POLYPS, HX OF 12/11/2006  . GERD 12/11/2006  . OBESITY 05/10/2008  . Acne   . HYPERTHYROIDISM 11/02/2009  . Hidradenitis suppurativa   . Anemia   . Allergy    Past Surgical History  Procedure Laterality Date  . Gastric bypass    . Breast surgery      cysts  . Tubal ligation    . Myomectomy    . Pilonidal cyst excision     Family History  Problem Relation Age of Onset  . Cancer      colon ca  . Stroke      family  . Hypertension Father   . Diabetes Father   . Hyperlipidemia Father   . Hypertension Mother   . Crohn's disease Sister   . Diabetes Sister    History  Substance Use Topics  . Smoking status: Former Smoker    Quit date: 01/09/2002  . Smokeless tobacco: Never Used  . Alcohol Use: No   OB  History    Gravida Para Term Preterm AB TAB SAB Ectopic Multiple Living   1 1 1       1      Review of Systems  All other systems reviewed and are negative.     Allergies  Review of patient's allergies indicates no known allergies.  Home Medications   Prior to Admission medications   Medication Sig Start Date End Date Taking? Authorizing Provider  albuterol (PROVENTIL HFA;VENTOLIN HFA) 108 (90 BASE) MCG/ACT inhaler Inhale 2 puffs into the lungs every 6 (six) hours as needed for wheezing. 05/15/13   Lisabeth Pick, MD  Alum & Mag Hydroxide-Simeth (MAGIC MOUTHWASH) SOLN Take 5 mLs by mouth 3 (three) times daily. 10/09/13   Lafayette Dragon, MD  azithromycin (ZITHROMAX) 250 MG tablet 2 tab po on day #1 then 1 tab po daily until complete 01/07/14   Gerda Diss, DO  B Complex Vitamins (B-COMPLEX/B-12 PO) Take by mouth.    Historical Provider, MD  beclomethasone (QVAR) 40 MCG/ACT inhaler Inhale 2 puffs into the lungs 2 (two) times daily. 05/20/12   Lisabeth Pick, MD  chlorpheniramine-HYDROcodone (TUSSIONEX PENNKINETIC ER) 10-8 MG/5ML LQCR Take 5 mLs by mouth every 12 (twelve) hours as needed for cough. 10/12/13   Timoteo Gaul, FNP  ferrous sulfate (FER-IRON) 75 (15 FE) MG/ML SOLN Take 27 mg by mouth daily.    Historical  Provider, MD  Multiple Vitamin (MULTIVITAMIN) tablet Take 1 tablet by mouth daily.      Historical Provider, MD  ORSYTHIA 0.1-20 MG-MCG tablet  01/17/13   Historical Provider, MD  pantoprazole (PROTONIX) 40 MG tablet Take 1 tablet (40 mg total) by mouth 2 (two) times daily. 10/12/13   Timoteo Gaul, FNP  sertraline (ZOLOFT) 50 MG tablet TAKE 1&1/2 TABLETS BY MOUTH EVERY DAY 02/15/14   Lisabeth Pick, MD  traZODone (DESYREL) 50 MG tablet Take 2 tablets (100 mg total) by mouth at bedtime as needed for sleep. 05/15/13   Lisabeth Pick, MD   BP 141/81 mmHg  Pulse 82  Temp(Src) 97.6 F (36.4 C) (Oral)  Resp 18  Wt 204 lb (92.534 kg)  SpO2 99%  LMP 04/25/2014 Physical  Exam  Constitutional: She appears well-developed and well-nourished. No distress.  HENT:  Head: Atraumatic.  Eyes: Conjunctivae are normal.  Neck: Neck supple.  Cardiovascular: Normal rate and regular rhythm.   Pulmonary/Chest: Effort normal and breath sounds normal.  Abdominal: Soft. There is tenderness (Mild periumbilical and epigastric abdominal tenderness without guarding or rebound tenderness. Negative Murphy sign, no pain at McBurney's point).  Genitourinary:  Chaperone present during exam: Normal rectal tone, no mass, no impaction, normal color stool, Hemoccult negative.  Neurological: She is alert.  Skin: No rash noted.  Psychiatric: She has a normal mood and affect.  Nursing note and vitals reviewed.   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 She has a nonsurgical abdomen. She is afebrile, vital signs stable, workup initiated. Low suspicion for colitis, diverticulitis, or appendicitis given the location of the pain and no peritoneal signs.  2:30 PM Since patient reported having postprandial pain and having upper abdominal pain and abdominal ultrasound was performed showing evidence of gallstones but no evidence of cholecystitis. Patient states that her pain is much improved at this time. Her labs are reassuring. I recommend patient to follow-up with general surgery for possible elective cholecystectomy if indicated. Patient to follow-up with PCP for further care. We'll provide symptomatically treatment. Return precautions discussed.  Labs Review Labs Reviewed  CBC WITH DIFFERENTIAL/PLATELET - Abnormal; Notable for the following:    RBC 3.85 (*)    Hemoglobin 10.2 (*)    HCT 33.0 (*)    RDW 15.6 (*)    Platelets 432 (*)    All other components within normal limits  COMPREHENSIVE METABOLIC PANEL - Abnormal; Notable for the following:    Albumin  3.4 (*)    All other components within normal limits  URINALYSIS, ROUTINE W REFLEX MICROSCOPIC - Abnormal; Notable for the following:    Specific Gravity, Urine 1.041 (*)    Bilirubin Urine SMALL (*)    All other components within normal limits  LIPASE, BLOOD  POC OCCULT BLOOD, ED    Imaging Review US Abdomen Complete  07/25/2014   CLINICAL DATA:  Recurrent abdominal pain over the last 4 days.  EXAM: ULTRASOUND ABDOMEN COMPLETE  COMPARISON:  11/14/2008  FINDINGS: Gallbladder: Mobile shadowing 8 mm hyperechogenicity/filling defect in the gallbladder, faint associated shadowing as on image 94, compatible with gallstone. No gallbladder wall thickening or pericholecystic fluid. Sonographic Murphy sign indeterminate due to patient medication.  Common bile duct: Diameter: 3 mm  Liver: No focal lesion identified. Within normal limits in parenchymal echogenicity.  IVC: No abnormality visualized.  Pancreas: Poorly seen due to overlying  bowel gas.  Spleen: Size and appearance within normal limits.  Right Kidney: Length: 10.7 cm. Echogenicity within normal limits. No mass or hydronephrosis visualized.  Left Kidney: Length: 10.6 cm. Echogenicity within normal limits. No mass or hydronephrosis visualized.  Abdominal aorta: No aneurysm visualized. Mid portion of abdominal aorta poorly seen due to overlying bowel gas.  Other findings: None.  IMPRESSION: 1. Single mobile gallstone in the gallbladder, without gallbladder wall thickening or pericholecystic fluid. Sonographic Murphy sign indeterminate due to patient medication. 2. Poor visualization of parts of the pancreas and abdominal aorta due to overlying bowel gas. 3.  Otherwise, no significant abnormalities are observed.   Electronically Signed   By: Van Clines M.D.   On: 07/25/2014 14:21     EKG Interpretation None      MDM   Final diagnoses:  Abdominal pain, recurrent  Calculus of gallbladder without cholecystitis without obstruction    BP  141/81 mmHg  Pulse 82  Temp(Src) 97.6 F (36.4 C) (Oral)  Resp 18  Wt 204 lb (92.534 kg)  SpO2 99%  LMP 04/25/2014  I have reviewed nursing notes and vital signs. I personally reviewed the imaging tests through PACS system  I reviewed available ER/hospitalization records thought the EMR     Domenic Moras, PA-C 07/25/14 1437  Virgel Manifold, MD 07/27/14 [phone]

## 2014-07-25 NOTE — Discharge Instructions (Signed)
Please follow up with your doctor for further evaluation of your abdominal pain.  You have evidence of gallstone in your gallbladder that may contribute to your abdominal pain.  Follow up with surgery for further management if pain persists.   Cholelithiasis Cholelithiasis (also called gallstones) is a form of gallbladder disease in which gallstones form in your gallbladder. The gallbladder is an organ that stores bile made in the liver, which helps digest fats. Gallstones begin as small crystals and slowly grow into stones. Gallstone pain occurs when the gallbladder spasms and a gallstone is blocking the duct. Pain can also occur when a stone passes out of the duct.  RISK FACTORS  Being female.   Having multiple pregnancies. Health care providers sometimes advise removing diseased gallbladders before future pregnancies.   Being obese.  Eating a diet heavy in fried foods and fat.   Being older than 28 years and increasing age.   Prolonged use of medicines containing female hormones.   Having diabetes mellitus.   Rapidly losing weight.   Having a family history of gallstones (heredity).  SYMPTOMS  Nausea.   Vomiting.  Abdominal pain.   Yellowing of the skin (jaundice).   Sudden pain. It may persist from several minutes to several hours.  Fever.   Tenderness to the touch. In some cases, when gallstones do not move into the bile duct, people have no pain or symptoms. These are called "silent" gallstones.  TREATMENT Silent gallstones do not need treatment. In severe cases, emergency surgery may be required. Options for treatment include:  Surgery to remove the gallbladder. This is the most common treatment.  Medicines. These do not always work and may take 6-12 months or more to work.  Shock wave treatment (extracorporeal biliary lithotripsy). In this treatment an ultrasound machine sends shock waves to the gallbladder to break gallstones into smaller pieces  that can pass into the intestines or be dissolved by medicine. HOME CARE INSTRUCTIONS   Only take over-the-counter or prescription medicines for pain, discomfort, or fever as directed by your health care provider.   Follow a low-fat diet until seen again by your health care provider. Fat causes the gallbladder to contract, which can result in pain.   Follow up with your health care provider as directed. Attacks are almost always recurrent and surgery is usually required for permanent treatment.  SEEK IMMEDIATE MEDICAL CARE IF:   Your pain increases and is not controlled by medicines.   You have a fever or persistent symptoms for more than 2-3 days.   You have a fever and your symptoms suddenly get worse.   You have persistent nausea and vomiting.  MAKE SURE YOU:   Understand these instructions.  Will watch your condition.  Will get help right away if you are not doing well or get worse. Document Released: 04/05/2005 Document Revised: 12/10/2012 Document Reviewed: 10/01/2012 [facility] Patient Information 2015 [place], [place]. This information is not intended to replace advice given to you by your health care provider. Make sure you discuss any questions you have with your health care provider.

## 2014-07-25 NOTE — ED Notes (Signed)
Pt from home c/o pain under umbilicus that radiates through to her back since last weekend. Denies nausea, vomiting, or diarrhea but reports dark stools. She reports gastric bypass in 10/2008 and had an ulceration.

## 2014-08-10 ENCOUNTER — Other Ambulatory Visit: Payer: Self-pay | Admitting: Surgery

## 2014-09-14 NOTE — Patient Instructions (Addendum)
Annette Brooks  09/14/2014   Your procedure is scheduled on:    09/21/14    Report to [facility] Main  Entrance and follow signs to               [place] at         1100 AM.  Call this number if you have problems the morning of surgery [phone]   Remember: ONLY 1 PERSON MAY GO WITH YOU TO SHORT STAY TO GET  READY MORNING OF [place].  Do not eat food or drink liquids :After Midnight.     Take these medicines the morning of surgery with A SIP OF WATER:   Albuterol Inhaler and bring, Zantac                                You may not have any metal on your body including hair pins and              piercings  Do not wear jewelry, make-up, lotions, powders or perfumes, deodorant             Do not wear nail polish.  Do not shave  48 hours prior to surgery.     Do not bring valuables to the hospital. [place].  Contacts, dentures or bridgework may not be worn into surgery.  Leave suitcase in the car. After surgery it may be brought to your room.       Special Instructions: coughing and deep breathing exercises, leg exercises               Please read over the following fact sheets you were given: _____________________________________________________________________             [facility] - Preparing for Surgery Before surgery, you can play an important role.  Because skin is not sterile, your skin needs to be as free of germs as possible.  You can reduce the number of germs on your skin by washing with CHG (chlorahexidine gluconate) soap before surgery.  CHG is an antiseptic cleaner which kills germs and bonds with the skin to continue killing germs even after washing. Please DO NOT use if you have an allergy to CHG or antibacterial soaps.  If your skin becomes reddened/irritated stop using the CHG and inform your nurse when you arrive at Short Stay. Do not shave (including legs and underarms)  for at least 48 hours prior to the first CHG shower.  You may shave your face/neck. Please follow these instructions carefully:  1.  Shower with CHG Soap the night before surgery and the  morning of Surgery.  2.  If you choose to wash your hair, wash your hair first as usual with your  normal  shampoo.  3.  After you shampoo, rinse your hair and body thoroughly to remove the  shampoo.                           4.  Use CHG as you would any other liquid soap.  You can apply chg directly  to the skin and wash  Gently with a scrungie or clean washcloth.  5.  Apply the CHG Soap to your body ONLY FROM THE NECK DOWN.   Do not use on face/ open                           Wound or open sores. Avoid contact with eyes, ears mouth and genitals (private parts).                       Wash face,  Genitals (private parts) with your normal soap.             6.  Wash thoroughly, paying special attention to the area where your surgery  will be performed.  7.  Thoroughly rinse your body with warm water from the neck down.  8.  DO NOT shower/wash with your normal soap after using and rinsing off  the CHG Soap.                9.  Pat yourself dry with a clean towel.            10.  Wear clean pajamas.            11.  Place clean sheets on your bed the night of your first shower and do not  sleep with pets. Day of Surgery : Do not apply any lotions/deodorants the morning of surgery.  Please wear clean clothes to the hospital/surgery center.  FAILURE TO FOLLOW THESE INSTRUCTIONS MAY RESULT IN THE CANCELLATION OF YOUR SURGERY PATIENT SIGNATURE_________________________________  NURSE SIGNATURE__________________________________  ________________________________________________________________________    CLEAR LIQUID DIET   Foods Allowed                                                                     Foods Excluded  Coffee and tea, regular and decaf                             liquids  that you cannot  Plain Jell-O in any flavor                                             see through such as: Fruit ices (not with fruit pulp)                                     milk, soups, orange juice  Iced Popsicles                                    All solid food Carbonated beverages, regular and diet                                    Cranberry, grape and apple juices Sports drinks like Gatorade Lightly seasoned clear broth or consume(fat free) Sugar, honey syrup  Sample Menu Breakfast                                Lunch                                     Supper Cranberry juice                    Beef broth                            Chicken broth Jell-O                                     Grape juice                           Apple juice Coffee or tea                        Jell-O                                      Popsicle                                                Coffee or tea                        Coffee or tea  _____________________________________________________________________

## 2014-09-16 ENCOUNTER — Encounter (HOSPITAL_COMMUNITY): Payer: Self-pay

## 2014-09-16 ENCOUNTER — Encounter (HOSPITAL_COMMUNITY)
Admission: RE | Admit: 2014-09-16 | Discharge: 2014-09-16 | Disposition: A | Discharge status: Home / Self Care | Payer: Managed Care, Other (non HMO) | Source: Ambulatory Visit | Attending: Surgery | Admitting: Surgery

## 2014-09-16 DIAGNOSIS — K829 Disease of gallbladder, unspecified: Secondary | ICD-10-CM | POA: Diagnosis not present

## 2014-09-16 DIAGNOSIS — Z01818 Encounter for other preprocedural examination: Secondary | ICD-10-CM | POA: Diagnosis present

## 2014-09-16 LAB — COMPREHENSIVE METABOLIC PANEL
ALBUMIN: 3.5 g/dL (ref 3.5–5.0)
ALT: 12 U/L — AB (ref 14–54)
AST: 18 U/L (ref 15–41)
Alkaline Phosphatase: 66 U/L (ref 38–126)
Anion gap: 7 (ref 5–15)
BILIRUBIN TOTAL: 0.3 mg/dL (ref 0.3–1.2)
BUN: 10 mg/dL (ref 6–20)
CHLORIDE: 109 mmol/L (ref 101–111)
CO2: 23 mmol/L (ref 22–32)
Calcium: 8.5 mg/dL — ABNORMAL LOW (ref 8.9–10.3)
Creatinine, Ser: 0.75 mg/dL (ref 0.44–1.00)
GFR calc Af Amer: >60 mL/min (ref >60)
GLUCOSE: 93 mg/dL (ref 65–99)
Potassium: 4.4 mmol/L (ref 3.5–5.1)
Sodium: 139 mmol/L (ref 135–145)
Total Protein: 7.6 g/dL (ref 6.5–8.1)

## 2014-09-16 LAB — CBC WITH DIFFERENTIAL/PLATELET
Basophils Absolute: 0 10*3/uL (ref 0.0–0.1)
Basophils Relative: 0 % (ref 0–1)
EOS ABS: 0.1 10*3/uL (ref 0.0–0.7)
EOS PCT: 1 % (ref 0–5)
HEMATOCRIT: 31.8 % — AB (ref 36.0–46.0)
Hemoglobin: 9.7 g/dL — ABNORMAL LOW (ref 12.0–15.0)
LYMPHS ABS: 1.9 10*3/uL (ref 0.7–4.0)
LYMPHS PCT: 29 % (ref 12–46)
MCH: 25.9 pg — ABNORMAL LOW (ref 26.0–34.0)
MCHC: 30.5 g/dL (ref 30.0–36.0)
MCV: 84.8 fL (ref 78.0–100.0)
Monocytes Absolute: 0.4 10*3/uL (ref 0.1–1.0)
Monocytes Relative: 7 % (ref 3–12)
Neutro Abs: 4.1 10*3/uL (ref 1.7–7.7)
Neutrophils Relative %: 63 % (ref 43–77)
PLATELETS: 427 10*3/uL — AB (ref 150–400)
RBC: 3.75 MIL/uL — AB (ref 3.87–5.11)
RDW: 15.7 % — ABNORMAL HIGH (ref 11.5–15.5)
WBC: 6.6 10*3/uL (ref 4.0–10.5)

## 2014-09-16 LAB — HCG, SERUM, QUALITATIVE: Preg, Serum: NEGATIVE

## 2014-09-16 NOTE — Progress Notes (Signed)
CBC/DIFF done 09/16/14 faxed via EPIC to Dr Alphonsa Overall.

## 2014-09-16 NOTE — Progress Notes (Signed)
12/29/13 2VCXR in [employer]

## 2014-09-20 NOTE — H&P (Signed)
Annette Brooks 08/02/2014 4:44 PM Location: [facility] Patient #: [id number] DOB: [birth year] Single / Language: Annette Brooks / Race: White Female  History of Present Illness \ Patient words: gallstones.  The patient is a 49 year old female who presents with abdominal pain. Her PCP is [name]. Her GYN is at [facility], but she cannot remember the names of the PA's she sees at each place. She comes by herself.   She comes for 3 complaints. First she has reflux almost daily. Secondly, she has abdominal pain in the epigastrium radiating to the right side once or twice a week. Thirdly, she has night sweats almost every night.   She says she can't eat raw vegetables and candy beans. This led her to go to the emergency room on 25 July 2014. An ultrasound showed a gallstone, but no evidence of cholecystitis. She did undergo an upper endoscopy by Dr. [name] with dilatation in April 2015. This helped a lot with her symptoms for about 6 months, but then she started having reflux again. She takes daily liquid antacids and takes Protonix daily. Because of her schedule and her daughter's schedule, she does admit to eating out a lot, which she's tried to cut back on. She says she takes at least a couple beers a day. I reminded this is a source of calories. She is up for colonoscopy this summer.   I discussed with the patient the indications and risks of gall bladder surgery. The primary risks of gall bladder surgery include, but are not limited to, bleeding, infection, common bile duct injury, and open surgery. There is also the risk that the patient may have continued symptoms after surgery. We discussed the typical post-operative recovery course. I tried to answer the patient's questions. I think that the question is whether surgery will help her. It is hard to know. They have found a gall stone. And this could be causing her symptoms, but she has other things going on,  not easily explained by gall bladder disease. I gave the patient literature about gall bladder surgery.  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 Pre op weight - 253, BMI - 42.2 Doing well, except for "constant heart burn".  Past Medical History:  2. History of marginal ulcer.  Last endoscopy by D. Lucia Gaskins 01/07/2009.  Her last endoscopy was by Dr. [name] around 07/2013. No ulcer seen, but he did dilate her esophagus. 3. Diverticulosis on colonoscopy by Dr. [name] - 2009 She has received a card from Dr. [name] about follow up colonoscopy. He could do the upper endo and colonoscopy at the same time. 4. Trouble with teeth - this is about the same. Has had caps put on a lot of her teeth. 5. Trouble with hair - this is better  Social History: Unmarried Works with [employer]. She has one daughter. Her daughter is a Ship broker at [employer] in nursing to finish May 2016.  Other Problems Charlott Calvario, CMA; 08/02/2014 4:44 PM) Alcohol Abuse Asthma Back Pain Cholelithiasis Diverticulosis Gastric Ulcer Gastroesophageal Reflux Disease High blood pressure  Past Surgical History Lynelle Weiler, CMA; 08/02/2014 4:44 PM) Breast Biopsy Bilateral. Colon Polyp Removal - Colonoscopy Gastric Bypass  Diagnostic Studies History [name], CMA; 08/02/2014 4:44 PM) Colonoscopy 1-5 years ago Mammogram within last year Pap Smear 1-5 years ago  Allergies [name], CMA; 08/02/2014 4:45 PM) No Known Drug Allergies04/02/2015  Medication History ([name], CMA; 08/02/2014 4:46 PM) Alrex (0.2% Suspension, Ophthalmic) Active. Junel FE 1.5/30 (1.5-30MG -MCG Tablet, Oral)  Active. Gildess 1.5/30 (1.5-30MG -MCG Tablet, Oral) Active. Ondansetron HCl (4MG  Tablet, Oral) Active. TraZODone HCl (50MG  Tablet, Oral) Active. Sertraline HCl (100MG  Tablet, Oral) Active. Ventolin HFA (108 (90 Base)MCG/ACT Aerosol Soln, Inhalation)  Active. Dicyclomine HCl (20MG  Tablet, Oral) Active. Medications Reconciled  Pregnancy / Birth History [name], CMA; 08/02/2014 4:44 PM) Age at menarche 38 years. Contraceptive History Oral contraceptives. Gravida 1 Maternal age 45-30 Para 1 Regular periods  Review of Systems ([place]; 08/02/2014 4:44 PM) General Present- Night Sweats. Not Present- Appetite Loss, Chills, Fatigue, Fever, Weight Gain and Weight Loss. Skin Present- New Lesions. Not Present- Change in Wart/Mole, Dryness, Hives, Jaundice, Non-Healing Wounds, Rash and Ulcer. HEENT Present- Wears glasses/contact lenses. Not Present- Earache, Hearing Loss, Hoarseness, Nose Bleed, Oral Ulcers, Ringing in the Ears, Seasonal Allergies, Sinus Pain, Sore Throat, Visual Disturbances and Yellow Eyes. Cardiovascular Present- Shortness of Breath and Swelling of Extremities. Not Present- Chest Pain, Difficulty Breathing Lying Down, Leg Cramps, Palpitations and Rapid Heart Rate. Gastrointestinal Present- Abdominal Pain, Change in Bowel Habits, Chronic diarrhea, Excessive gas and Vomiting. Not Present- Bloating, Bloody Stool, Constipation, Difficulty Swallowing, Gets full quickly at meals, Hemorrhoids, Indigestion, Nausea and Rectal Pain. Female Genitourinary Present- Urgency. Not Present- Frequency, Nocturia, Painful Urination and Pelvic Pain. Psychiatric Present- Change in Sleep Pattern. Not Present- Anxiety, Bipolar, Depression, Fearful and Frequent crying. Endocrine Present- Hot flashes. Not Present- Cold Intolerance, Excessive Hunger, Hair Changes, Heat Intolerance and New Diabetes.   Vitals ([name] CMA; 08/02/2014 4:45 PM) 08/02/2014 4:44 PM Weight: 205 lb Height: 65in Body Surface Area: 2.07 m Body Mass Index: 34.11 kg/m Temp.: 98.24F(Temporal)  Pulse: 77 (Regular)  BP: 124/78 (Sitting, Left Arm, Standard)  Physical Exam General: WN AA F alert and generally healthy appearing. HEENT: Normal.  Pupils equal.  Neck: Supple. No mass. No thyroid mass. Lymph Nodes: No supraclavicular or cervical nodes.  Lungs: Clear to auscultation and symmetric breath sounds. Heart: RRR. No murmur or rub.  Abdomen: Soft. No mass. No tenderness. No hernia. Normal bowel sounds. Scars of the gastric bypass. Rectal: Not done.  Extremities: Good strength and ROM in upper and lower extremities.  Neurologic: Grossly intact to motor and sensory function. Psychiatric: Has normal mood and affect. Behavior is normal.  Assessment & Plan  1.  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 I am not sure this is source of all of her symptoms, but I think the only way to know what it is causing is to remove it.  Current Plans   Schedule for Surgery  2.  CHRONIC GERD (530.81  K21.9)  Impression: I have pictures in her chart of my endo on 01/07/2009 which show a marginal ulcer, but this does not seem like that.  Plan upped endo at the same time as lap chole.  3. 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 Lilyan Prete  Pre op weight - 253, BMI - 42.2  Alphonsa Overall, MD, [facility] Pager: [phone] Office phone:  [phone]

## 2014-09-21 ENCOUNTER — Ambulatory Visit (HOSPITAL_COMMUNITY): Payer: Managed Care, Other (non HMO) | Admitting: Anesthesiology

## 2014-09-21 ENCOUNTER — Encounter (HOSPITAL_COMMUNITY): Payer: Self-pay | Admitting: *Deleted

## 2014-09-21 ENCOUNTER — Ambulatory Visit (HOSPITAL_COMMUNITY): Payer: Managed Care, Other (non HMO)

## 2014-09-21 ENCOUNTER — Ambulatory Visit (HOSPITAL_COMMUNITY)
Admission: RE | Admit: 2014-09-21 | Discharge: 2014-09-21 | Disposition: A | Discharge status: Home / Self Care | Payer: Managed Care, Other (non HMO) | Source: Ambulatory Visit | Attending: Surgery | Admitting: Surgery

## 2014-09-21 ENCOUNTER — Encounter (HOSPITAL_COMMUNITY)
Admission: RE | Disposition: A | Discharge status: Home / Self Care | Payer: Self-pay | Source: Ambulatory Visit | Attending: Surgery

## 2014-09-21 DIAGNOSIS — J45909 Unspecified asthma, uncomplicated: Secondary | ICD-10-CM | POA: Insufficient documentation

## 2014-09-21 DIAGNOSIS — Z87891 Personal history of nicotine dependence: Secondary | ICD-10-CM | POA: Insufficient documentation

## 2014-09-21 DIAGNOSIS — K5730 Diverticulosis of large intestine without perforation or abscess without bleeding: Secondary | ICD-10-CM | POA: Diagnosis not present

## 2014-09-21 DIAGNOSIS — Z86010 Personal history of colonic polyps: Secondary | ICD-10-CM | POA: Diagnosis not present

## 2014-09-21 DIAGNOSIS — K8020 Calculus of gallbladder without cholecystitis without obstruction: Secondary | ICD-10-CM | POA: Diagnosis present

## 2014-09-21 DIAGNOSIS — Z419 Encounter for procedure for purposes other than remedying health state, unspecified: Secondary | ICD-10-CM

## 2014-09-21 DIAGNOSIS — F1010 Alcohol abuse, uncomplicated: Secondary | ICD-10-CM | POA: Insufficient documentation

## 2014-09-21 DIAGNOSIS — I10 Essential (primary) hypertension: Secondary | ICD-10-CM | POA: Insufficient documentation

## 2014-09-21 DIAGNOSIS — K219 Gastro-esophageal reflux disease without esophagitis: Secondary | ICD-10-CM | POA: Insufficient documentation

## 2014-09-21 DIAGNOSIS — E0590 Thyrotoxicosis, unspecified without thyrotoxic crisis or storm: Secondary | ICD-10-CM | POA: Insufficient documentation

## 2014-09-21 DIAGNOSIS — Z9884 Bariatric surgery status: Secondary | ICD-10-CM | POA: Insufficient documentation

## 2014-09-21 DIAGNOSIS — D649 Anemia, unspecified: Secondary | ICD-10-CM | POA: Insufficient documentation

## 2014-09-21 HISTORY — PX: CHOLECYSTECTOMY: SHX55

## 2014-09-21 SURGERY — LAPAROSCOPIC CHOLECYSTECTOMY WITH INTRAOPERATIVE CHOLANGIOGRAM
Anesthesia: General

## 2014-09-21 MED ORDER — HYDROMORPHONE HCL 1 MG/ML IJ SOLN
INTRAMUSCULAR | Status: DC | PRN
  Administered 2014-09-21 (×2): 0.5 mg via INTRAVENOUS
  Administered 2014-09-21: 1 mg via INTRAVENOUS

## 2014-09-21 MED ORDER — ROCURONIUM BROMIDE 100 MG/10ML IV SOLN
INTRAVENOUS | Status: DC | PRN
  Administered 2014-09-21: 30 mg via INTRAVENOUS

## 2014-09-21 MED ORDER — GLYCOPYRROLATE 0.2 MG/ML IJ SOLN
INTRAMUSCULAR | Status: DC | PRN
  Administered 2014-09-21: .6 mg via INTRAVENOUS

## 2014-09-21 MED ORDER — CEFAZOLIN SODIUM-DEXTROSE 2-3 GM-% IV SOLR
2.0000 g | INTRAVENOUS | Status: AC
  Administered 2014-09-21: 2 g via INTRAVENOUS

## 2014-09-21 MED ORDER — DEXAMETHASONE SODIUM PHOSPHATE 10 MG/ML IJ SOLN
INTRAMUSCULAR | Status: DC | PRN
  Administered 2014-09-21: 10 mg via INTRAVENOUS

## 2014-09-21 MED ORDER — LACTATED RINGERS IV SOLN
INTRAVENOUS | Status: DC | PRN
  Administered 2014-09-21: 13:00:00 via INTRAVENOUS

## 2014-09-21 MED ORDER — PROPOFOL 10 MG/ML IV BOLUS
INTRAVENOUS | Status: DC | PRN
  Administered 2014-09-21: 150 mg via INTRAVENOUS

## 2014-09-21 MED ORDER — CHLORHEXIDINE GLUCONATE 4 % EX LIQD: 1.0000 "application " | Freq: Once | CUTANEOUS | Status: DC

## 2014-09-21 MED ORDER — ONDANSETRON HCL 4 MG/2ML IJ SOLN
4.0000 mg | Freq: Once | INTRAMUSCULAR | Status: AC | PRN
  Administered 2014-09-21: 4 mg via INTRAVENOUS
  Filled 2014-09-21: qty 2

## 2014-09-21 MED ORDER — HYDROMORPHONE HCL 1 MG/ML IJ SOLN
INTRAMUSCULAR | Status: DC
Start: 2014-09-21 — End: 2014-09-21
  Filled 2014-09-21: qty 1

## 2014-09-21 MED ORDER — MIDAZOLAM HCL 5 MG/5ML IJ SOLN
INTRAMUSCULAR | Status: DC | PRN
  Administered 2014-09-21: 2 mg via INTRAVENOUS

## 2014-09-21 MED ORDER — BUPIVACAINE-EPINEPHRINE 0.25% -1:200000 IJ SOLN
INTRAMUSCULAR | Status: AC
  Filled 2014-09-21: qty 1

## 2014-09-21 MED ORDER — FENTANYL CITRATE (PF) 100 MCG/2ML IJ SOLN
INTRAMUSCULAR | Status: AC
Start: 2014-09-21 — End: 2014-09-21
  Filled 2014-09-21: qty 2

## 2014-09-21 MED ORDER — BUPIVACAINE HCL (PF) 0.25 % IJ SOLN
INTRAMUSCULAR | Status: DC | PRN
  Administered 2014-09-21: 30 mL

## 2014-09-21 MED ORDER — FENTANYL CITRATE (PF) 100 MCG/2ML IJ SOLN
INTRAMUSCULAR | Status: DC | PRN
  Administered 2014-09-21: 50 ug via INTRAVENOUS
  Administered 2014-09-21: 100 ug via INTRAVENOUS
  Administered 2014-09-21: 50 ug via INTRAVENOUS

## 2014-09-21 MED ORDER — HYDROMORPHONE HCL 1 MG/ML IJ SOLN
0.5000 mg | INTRAMUSCULAR | Status: DC | PRN
  Administered 2014-09-21 (×3): 0.5 mg via INTRAVENOUS

## 2014-09-21 MED ORDER — MIDAZOLAM HCL 2 MG/2ML IJ SOLN
INTRAMUSCULAR | Status: AC
  Filled 2014-09-21: qty 2

## 2014-09-21 MED ORDER — HYDROMORPHONE HCL 2 MG/ML IJ SOLN
INTRAMUSCULAR | Status: AC
  Filled 2014-09-21: qty 1

## 2014-09-21 MED ORDER — ONDANSETRON HCL 4 MG/2ML IJ SOLN
INTRAMUSCULAR | Status: DC | PRN
  Administered 2014-09-21: 4 mg via INTRAVENOUS

## 2014-09-21 MED ORDER — BUPIVACAINE HCL (PF) 0.25 % IJ SOLN
INTRAMUSCULAR | Status: AC
  Filled 2014-09-21: qty 30

## 2014-09-21 MED ORDER — FENTANYL CITRATE (PF) 100 MCG/2ML IJ SOLN
INTRAMUSCULAR | Status: AC
  Filled 2014-09-21: qty 2

## 2014-09-21 MED ORDER — PROPOFOL 10 MG/ML IV BOLUS
INTRAVENOUS | Status: AC
  Filled 2014-09-21: qty 20

## 2014-09-21 MED ORDER — LIDOCAINE HCL (CARDIAC) 20 MG/ML IV SOLN
INTRAVENOUS | Status: DC | PRN
  Administered 2014-09-21: 50 mg via INTRAVENOUS

## 2014-09-21 MED ORDER — 0.9 % SODIUM CHLORIDE (POUR BTL) OPTIME
TOPICAL | Status: DC | PRN
  Administered 2014-09-21: 1000 mL

## 2014-09-21 MED ORDER — NEOSTIGMINE METHYLSULFATE 10 MG/10ML IV SOLN
INTRAVENOUS | Status: DC | PRN
  Administered 2014-09-21: 3 mg via INTRAVENOUS

## 2014-09-21 MED ORDER — DIATRIZOATE MEGLUMINE 30 % UR SOLN
URETHRAL | Status: DC | PRN
  Administered 2014-09-21: 2.5 mL via URETHRAL

## 2014-09-21 MED ORDER — HYDROMORPHONE HCL 1 MG/ML IJ SOLN
INTRAMUSCULAR | Status: AC
  Filled 2014-09-21: qty 1

## 2014-09-21 MED ORDER — OXYCODONE HCL 5 MG PO TABS: 5.0000 mg | ORAL_TABLET | Freq: Four times a day (QID) | ORAL | Status: DC | PRN

## 2014-09-21 MED ORDER — LACTATED RINGERS IR SOLN
Status: DC | PRN
  Administered 2014-09-21: 1000 mL

## 2014-09-21 MED ORDER — CEFAZOLIN SODIUM-DEXTROSE 2-3 GM-% IV SOLR
INTRAVENOUS | Status: AC
  Filled 2014-09-21: qty 50

## 2014-09-21 SURGICAL SUPPLY — 32 items
APL SKNCLS STERI-STRIP NONHPOA (GAUZE/BANDAGES/DRESSINGS) ×1
APPLIER CLIP ROT 10 11.4 M/L (STAPLE)
APR CLP MED LRG 11.4X10 (STAPLE)
BAG SPEC RTRVL LRG 6X4 10 (ENDOMECHANICALS)
BENZOIN TINCTURE PRP APPL 2/3 (GAUZE/BANDAGES/DRESSINGS) ×2 IMPLANT
CHLORAPREP W/TINT 26ML (MISCELLANEOUS) ×2 IMPLANT
CHOLANGIOGRAM CATH TAUT (CATHETERS) ×2 IMPLANT
CLIP APPLIE ROT 10 11.4 M/L (STAPLE) ×1 IMPLANT
COVER MAYO STAND STRL (DRAPES) IMPLANT
DECANTER SPIKE VIAL GLASS SM (MISCELLANEOUS) ×2 IMPLANT
DRAPE C-ARM 42X120 X-RAY (DRAPES) IMPLANT
DRAPE LAPAROSCOPIC ABDOMINAL (DRAPES) ×2 IMPLANT
ELECT REM PT RETURN 9FT ADLT (ELECTROSURGICAL) ×2
ELECTRODE REM PT RTRN 9FT ADLT (ELECTROSURGICAL) ×1 IMPLANT
GLOVE SURG SIGNA 7.5 PF LTX (GLOVE) ×2 IMPLANT
GOWN STRL REUS W/TWL XL LVL3 (GOWN DISPOSABLE) ×7 IMPLANT
HEMOSTAT SURGICEL 4X8 (HEMOSTASIS) IMPLANT
IV CATH 14GX2 1/4 (CATHETERS) ×2 IMPLANT
IV SET EXTENSION CATH 6 NF (IV SETS) ×2 IMPLANT
KIT BASIN OR (CUSTOM PROCEDURE TRAY) ×2 IMPLANT
LIQUID BAND (GAUZE/BANDAGES/DRESSINGS) ×1 IMPLANT
POUCH SPECIMEN RETRIEVAL 10MM (ENDOMECHANICALS) IMPLANT
SET IRRIG TUBING LAPAROSCOPIC (IRRIGATION / IRRIGATOR) ×2 IMPLANT
SLEEVE XCEL OPT CAN 5 100 (ENDOMECHANICALS) ×3 IMPLANT
STOPCOCK 4 WAY LG BORE MALE ST (IV SETS) ×2 IMPLANT
STRIP CLOSURE SKIN 1/4X4 (GAUZE/BANDAGES/DRESSINGS) ×2 IMPLANT
SUT VIC AB 5-0 PS2 18 (SUTURE) ×2 IMPLANT
TOWEL OR 17X26 10 PK STRL BLUE (TOWEL DISPOSABLE) ×2 IMPLANT
TRAY LAPAROSCOPIC (CUSTOM PROCEDURE TRAY) ×2 IMPLANT
TROCAR BLADELESS OPT 5 100 (ENDOMECHANICALS) ×2 IMPLANT
TROCAR XCEL BLUNT TIP 100MML (ENDOMECHANICALS) ×2 IMPLANT
TROCAR XCEL NON-BLD 11X100MML (ENDOMECHANICALS) IMPLANT

## 2014-09-21 NOTE — Anesthesia Procedure Notes (Signed)
Procedure Name: Intubation Date/Time: 09/21/2014 12:58 PM Performed by: Anne Fu Pre-anesthesia Checklist: Patient identified, Emergency Drugs available, Suction available, Patient being monitored and Timeout performed Patient Re-evaluated:Patient Re-evaluated prior to inductionOxygen Delivery Method: Circle system utilized Preoxygenation: Pre-oxygenation with 100% oxygen Intubation Type: IV induction Ventilation: Mask ventilation without difficulty Laryngoscope Size: Mac and 4 Grade View: Grade II Tube type: Oral Tube size: 7.5 mm Number of attempts: 1 Airway Equipment and Method: Stylet Placement Confirmation: ETT inserted through vocal cords under direct vision,  positive ETCO2,  CO2 detector and breath sounds checked- equal and bilateral Secured at: 21 cm Tube secured with: Tape Dental Injury: Teeth and Oropharynx as per pre-operative assessment

## 2014-09-21 NOTE — Transfer of Care (Signed)
Immediate Anesthesia Transfer of Care Note  Patient: Annette Brooks  Procedure(s) Performed: Procedure(s): LAPAROSCOPIC CHOLECYSTECTOMY WITH INTRAOPERATIVE CHOLANGIOGRAM WITH UPPER ENDOSCOPY (N/A)  Patient Location: PACU  Anesthesia Type:General  Level of Consciousness:  sedated, patient cooperative and responds to stimulation  Airway & Oxygen Therapy:Patient Spontanous Breathing and Patient connected to face mask oxgen  Post-op Assessment:  Report given to PACU RN and Post -op Vital signs reviewed and stable  Post vital signs:  Reviewed and stable  Last Vitals:  Filed Vitals:   09/21/14 1051  BP: 121/69  Pulse: 78  Temp: 36.6 C  Resp: 18    Complications: No apparent anesthesia complications

## 2014-09-21 NOTE — Interval H&P Note (Signed)
History and Physical Interval Note:  09/21/2014 12:47 PM  Annette Brooks  has presented today for surgery, with the diagnosis of gall bladder disease history marginal ulcer  The various methods of treatment have been discussed with the patient and family.   Her mother is here with her.  After consideration of risks, benefits and other options for treatment, the patient has consented to  Procedure(s): LAPAROSCOPIC CHOLECYSTECTOMY WITH INTRAOPERATIVE CHOLANGIOGRAM WITH POSS UPPER ENDOSCOPY (N/A) as a surgical intervention .  The patient's history has been reviewed, patient examined, no change in status, stable for surgery.  I have reviewed the patient's chart and labs.  Questions were answered to the patient's satisfaction.     Milanya Sunderland H

## 2014-09-21 NOTE — Op Note (Signed)
09/21/2014  2:07 PM  PATIENT:  Annette Brooks, 49 y.o., female, MRN: [id number]  PREOP DIAGNOSIS:  gall bladder disease, history of marginal ulcer  POSTOP DIAGNOSIS:   Gallstones, normal upper endo  PROCEDURE:   Procedure(s): LAPAROSCOPIC CHOLECYSTECTOMY WITH INTRAOPERATIVE CHOLANGIOGRAM, UPPER ENDOSCOPY  SURGEON:   Alphonsa Overall, M.D.  Terrence DupontLeane Para, M.D.  ANESTHESIA:   general  Anesthesiologist: Kate Sable, MD CRNA: Garrel Ridgel, CRNA; Anne Fu, CRNA  General  ASA: 2  EBL:  minimal  ml  BLOOD ADMINISTERED: none  DRAINS: none   LOCAL MEDICATIONS USED:   30 cc 1/4% marcaine  SPECIMEN:   Gallbladder  COUNTS CORRECT:  YES  INDICATIONS FOR PROCEDURE:  MAYSA Brooks is a 49 y.o. (DOB: 01-07-1966) AA  female whose primary care physician is Chancy Hurter, MD and comes for cholecystectomy.   She has had a prior gastric bypass for weight loss.  She has had continued epigastric pain, so I plan to do an upper endoscopy at the same time as the gall bladder surgery.   The indications and risks of the gall bladder surgery were explained to the patient.  The risks include, but are not limited to, infection, bleeding, common bile duct injury and open surgery.  SURGERY:  The patient was taken to room #1 at [facility].  The abdomen was prepped with chloroprep.  The patient was given 2 gm Ancef at the beginning of the operation.   A time out was held and the surgical checklist run.   An infraumbilical incision was made into the abdominal cavity.  A 12 mm Hasson trocar was inserted into the abdominal cavity through the infraumbilical incision and secured with a 0 Vicryl suture.  Three additional trocars were inserted: a 5 mm trocar in the sub-xiphoid location, a 5 mm trocar in the right mid subcostal area, and a 5 mm trocar in the right lateral subcostal area.   The abdomen was explored and the liver, stomach, and bowel that could be seen were  unremarkable.  I looked at her prior Roux en Y gastric bypass and the small bowel looked okay without evidence of internal hernia or abnormality   The gall bladder did not look bad.  There were some adhesions to its undersurface, but it overall looked fairly normal.  I  grasped and rotated the gall bladder cephalad.  Disssection was carried down to the gall bladder/cystic duct junction and the cystic duct isolated.  A clip was placed on the gall bladder side of the cystic duct.   An intra-operative cholangiogram was shot.   The intra-operative cholangiogram was shot using a cut off Taut catheter placed through a 14 gauge angiocath in the RUQ.  The Taut catheter was inserted in the cut cystic duct and secured with an endoclip.  A cholangiogram was shot with 8 cc of 1/2 strength Omnipaque.  Using fluoroscopy, the cholangiogram showed the flow of contrast into the common bile duct, up the hepatic radicals, and into the duodenum.  There was no mass or obstruction.  This was a normal intra-operative cholangiogram.   The Taut catheter was removed.  The cystic duct was tripley endoclipped and the cystic artery was identified and clipped.  The gall bladder was bluntly and sharpley dissected from the gall bladder bed.   After the gall bladder was removed from the liver, the gall bladder bed and [place] were inspected.  There was no bleeding or bile leak.  The  gall bladder was placed in a endocatch bag and delivered through the umbilicus.  The abdomen was irrigated with 500 cc saline.   I then proceeded with an upper endoscopy.  I broke scrub to access her mouth and do the upper endoscopy.   A flexible Pentax endoscope was passed down the throat without difficulty.   Findings include:   Esophagus:   normal   GE junction at:  35 cm   Stomach pouch: normal   Gastrojejunal anastomosis:   40 cm She had one or two staples at the anastomosis.  But I saw no ulcer or mass.  Her GJ was widely  patent.   Efferent jejunal limb:  Normal        Afferent jejunal limb:  normal    I then rescrubbed back in to remove the trocars and close the wounds.   The trocars were then removed.  I infiltrated 30cc of 1/4% Marcaine into the incisions.  The umbilical port closed with a 0 Vicryl suture and the skin closed with 5-0 Monocryl.  The skin was painted with Dermabond.  The patient's sponge and needle count were correct.  The patient was transported to the RR in good condition.  Alphonsa Overall, MD, [facility] Pager: [phone] Office phone:  [phone]

## 2014-09-21 NOTE — Discharge Instructions (Signed)
[facility] - DISCHARGE INSTRUCTIONS TO PATIENT  Activity:  Driving - May drive in 2 to 4 days, if doing well and not taking oral pain meds.   Lifting - No lifting more than 15 pounds for one week.  Then no limit.  Wound Care:   Leave incisions dry for 2 days, then may shower.  Diet:  As tolerated.  Follow up appointment:  Call Dr. Pollie Friar office [facility]) at [phone] for an appointment in 2 to 4 weeks.  Medications and dosages:  Resume your home medications.  You have a prescription for:  Percocet  Call Dr. Lucia Gaskins or his office  [phone]) if you have:  Temperature greater than 100.4,  Persistent nausea and vomiting,  Severe uncontrolled pain,  Redness, tenderness, or signs of infection (pain, swelling, redness, odor or green/yellow discharge around the site),  Difficulty breathing, headache or visual disturbances,  Any other questions or concerns you may have after discharge.  In an emergency, call 911 or go to an Emergency Department at a nearby hospital.

## 2014-09-21 NOTE — Anesthesia Postprocedure Evaluation (Signed)
  Anesthesia Post-op Note  Patient: Annette Brooks  Procedure(s) Performed: Procedure(s): LAPAROSCOPIC CHOLECYSTECTOMY WITH INTRAOPERATIVE CHOLANGIOGRAM WITH UPPER ENDOSCOPY (N/A)  Patient Location: PACU  Anesthesia Type:General  Level of Consciousness: awake, alert , oriented and patient cooperative  Airway and Oxygen Therapy: Patient Spontanous Breathing  Post-op Pain: mild  Post-op Assessment: Post-op Vital signs reviewed, Patient's Cardiovascular Status Stable, Respiratory Function Stable, Patent Airway, No signs of Nausea or vomiting and Pain level controlled  Post-op Vital Signs: stable  Last Vitals:  Filed Vitals:   09/21/14 1615  BP: 113/60  Pulse: 69  Temp:   Resp: 17    Complications: No apparent anesthesia complications

## 2014-09-21 NOTE — Anesthesia Preprocedure Evaluation (Addendum)
Anesthesia Evaluation  Patient identified by MRN, date of birth, ID band Patient awake    Reviewed: Allergy & Precautions, NPO status , Patient's Chart, lab work & pertinent test results  Airway Mallampati: II       Dental   Pulmonary asthma , former smoker,    Pulmonary exam normal       Cardiovascular Normal cardiovascular exam    Neuro/Psych    GI/Hepatic GERD-  ,  Endo/Other  Hyperthyroidism   Renal/GU      Musculoskeletal   Abdominal   Peds  Hematology  (+) anemia ,   Anesthesia Other Findings   Reproductive/Obstetrics                            Anesthesia Physical Anesthesia Plan  ASA: II  Anesthesia Plan: General   Post-op Pain Management:    Induction: Intravenous  Airway Management Planned: Oral ETT  Additional Equipment:   Intra-op Plan:   Post-operative Plan: Extubation in OR  Informed Consent: I have reviewed the patients History and Physical, chart, labs and discussed the procedure including the risks, benefits and alternatives for the proposed anesthesia with the patient or authorized representative who has indicated his/her understanding and acceptance.     Plan Discussed with: CRNA, Anesthesiologist and Surgeon  Anesthesia Plan Comments:         Anesthesia Quick Evaluation

## 2014-09-22 ENCOUNTER — Encounter (HOSPITAL_COMMUNITY): Payer: Self-pay | Admitting: Surgery

## 2014-10-28 ENCOUNTER — Other Ambulatory Visit: Payer: Self-pay | Admitting: Obstetrics and Gynecology

## 2014-10-29 ENCOUNTER — Other Ambulatory Visit: Payer: Self-pay | Admitting: Obstetrics and Gynecology

## 2014-10-29 DIAGNOSIS — D259 Leiomyoma of uterus, unspecified: Secondary | ICD-10-CM

## 2014-10-29 DIAGNOSIS — N921 Excessive and frequent menstruation with irregular cycle: Secondary | ICD-10-CM

## 2014-11-03 ENCOUNTER — Ambulatory Visit
Admission: RE | Admit: 2014-11-03 | Discharge: 2014-11-03 | Disposition: A | Discharge status: Home / Self Care | Payer: Managed Care, Other (non HMO) | Source: Ambulatory Visit | Attending: Obstetrics and Gynecology | Admitting: Obstetrics and Gynecology

## 2014-11-03 ENCOUNTER — Other Ambulatory Visit: Payer: Self-pay | Admitting: Obstetrics and Gynecology

## 2014-11-03 DIAGNOSIS — N921 Excessive and frequent menstruation with irregular cycle: Secondary | ICD-10-CM

## 2014-11-03 DIAGNOSIS — D259 Leiomyoma of uterus, unspecified: Secondary | ICD-10-CM

## 2014-11-03 NOTE — Consult Note (Signed)
Chief Complaint: Chief Complaint  Patient presents with  . Advice Only    [place] Consult     Referring Physician(s): Fisher,Julie  History of Present Illness: Annette Brooks is a 49 y.o. female with chronic menorrhagia. She is gravida 1 para 25 with a 10 year old. She has had heavy bleeding for years and did undergo partial hysterectomy during her 82s. Most recently, since January of this year, she has had some form of vaginal bleeding every day. Currently she is on oral contraception to help control the elevated flow during her periods. She denies cramping or bloating and is primarily concerned with her hemoglobin levels. They have been as low as 6 and most recently, her hemoglobin was 10. She does feel less energetic than normal, likely due to her anemia. She has no future pregnancy plans and wishes to explore alternatives to hysterectomy.  Past Medical History  Diagnosis Date  . Anxiety state, unspecified 02/16/2009  . ASTHMA 12/11/2006  . Bariatric surgery status 02/16/2009  . COLONIC POLYPS, HX OF 12/11/2006  . GERD 12/11/2006  . OBESITY 05/10/2008  . Acne   . HYPERTHYROIDISM 11/02/2009  . Hidradenitis suppurativa   . Anemia   . Allergy     Past Surgical History  Procedure Laterality Date  . Gastric bypass    . Breast surgery      cysts  . Tubal ligation    . Myomectomy    . Pilonidal cyst excision    . Cholecystectomy N/A 09/21/2014    Procedure: LAPAROSCOPIC CHOLECYSTECTOMY WITH INTRAOPERATIVE CHOLANGIOGRAM WITH UPPER ENDOSCOPY;  Surgeon: Alphonsa Overall, MD;  Location: [facility];  Service: General;  Laterality: N/A;    Allergies: Hydrocodone  Medications: Prior to Admission medications   Medication Sig Start Date End Date Taking? Authorizing Provider  acetaminophen (TYLENOL) 500 MG tablet Take 1,000 mg by mouth every 2 (two) hours as needed for mild pain, moderate pain, fever or headache.   Yes Historical Provider, MD  albuterol (PROVENTIL HFA;VENTOLIN HFA) 108 (90  BASE) MCG/ACT inhaler Inhale 2 puffs into the lungs every 6 (six) hours as needed for wheezing. 05/15/13  Yes Lisabeth Pick, MD  Calcium Carbonate-Vitamin D 600-400 MG-UNIT per chew tablet Chew 1 tablet by mouth 2 (two) times daily.   Yes Historical Provider, MD  Cyanocobalamin (VITAMIN B 12 PO) Take 1 tablet by mouth every morning.    Yes Historical Provider, MD  GILDESS 1.5/30 1.5-30 MG-MCG tablet Take 1 tablet by mouth every morning.  07/02/14  Yes Historical Provider, MD  Multiple Vitamin (MULTIVITAMIN WITH MINERALS) TABS tablet Take 1 tablet by mouth every morning.    Yes Historical Provider, MD  pantoprazole (PROTONIX) 40 MG tablet Take 1 tablet (40 mg total) by mouth 2 (two) times daily. 10/12/13  Yes Kennyth Arnold, FNP  traZODone (DESYREL) 50 MG tablet Take 2 tablets (100 mg total) by mouth at bedtime as needed for sleep. Patient taking differently: Take 50-100 mg by mouth at bedtime as needed for sleep.  05/15/13  Yes Bruce Kendall Flack, MD  Alum & Mag Hydroxide-Simeth (MAGIC MOUTHWASH) SOLN Take 5 mLs by mouth 3 (three) times daily. Patient not taking: Reported on 07/25/2014 10/09/13   Lafayette Dragon, MD  beclomethasone (QVAR) 40 MCG/ACT inhaler Inhale 2 puffs into the lungs 2 (two) times daily. Patient not taking: Reported on 07/25/2014 05/20/12   Lisabeth Pick, MD  Hydrocodone-Acetaminophen 5-300 MG TABS Take 1 tablet by mouth every 4 (four) hours as needed (pain.).  Historical Provider, MD  ondansetron (ZOFRAN) 4 MG tablet Take 1 tablet (4 mg total) by mouth every 6 (six) hours. Patient not taking: Reported on 09/09/2014 07/25/14   Domenic Moras, PA-C  oxyCODONE (OXY IR/ROXICODONE) 5 MG immediate release tablet Take 1-2 tablets (5-10 mg total) by mouth every 6 (six) hours as needed for severe pain. Patient not taking: Reported on 11/03/2014 09/21/14   Alphonsa Overall, MD  ranitidine (ZANTAC) 150 MG tablet Take 150 mg by mouth 2 (two) times daily.    Historical Provider, MD     Family History  Problem  Relation Age of Onset  . Cancer      colon ca  . Stroke      family  . Hypertension Father   . Diabetes Father   . Hyperlipidemia Father   . Hypertension Mother   . Crohn's disease Sister   . Diabetes Sister     History   Social History  . Marital Status: Single    Spouse Name: N/A  . Number of Children: 1  . 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 Smoking status: Former Smoker    Quit date: 01/09/2002  . Smokeless tobacco: Never Used  . Alcohol Use: Yes     Comment: 3-4 beers daily   . Drug Use: No  . Sexual Activity: Yes    Birth Control/ Protection: Surgical     Comment: BTL    Other Topics Concern  . Not on file   Social History Narrative      Review of Systems: A 12 point ROS discussed and pertinent positives are indicated in the HPI above.  All other systems are negative.  Review of Systems  Vital Signs: BP 118/78 mmHg  Pulse 71  Temp(Src) 98 F (36.7 C) (Oral)  Resp 14  Ht 5\' 5"  (1.651 m)  Wt 205 lb (92.987 kg)  BMI 34.11 kg/m2  SpO2 99%  LMP 05/05/2014 (Approximate)  Physical Exam  Constitutional: She is oriented to person, place, and time. She appears well-developed and well-nourished.  Neck: Normal range of motion.  Cardiovascular: Normal rate, regular rhythm and normal heart sounds.   Pulmonary/Chest: Effort normal and breath sounds normal.  Abdominal: Soft.  Musculoskeletal: Normal range of motion.  B Fem/DP/PT pulses are 2+  Neurological: She is alert and oriented to person, place, and time.  Skin: Skin is warm and dry.  Psychiatric: She has a normal mood and affect.    Mallampati Score: 2     Imaging: No results found.  An office ultrasound describes multiple fibroids.  Endometrial biopsy was performed recently and was negative.  Labs:  CBC:  Recent Labs  07/25/14 1214 09/16/14 0855  WBC 7.2 6.6  HGB 10.2* 9.7*  HCT 33.0* 31.8*  PLT 432*  427*    COAGS: No results for input(s): INR, APTT in the last 8760 hours.  BMP:  Recent Labs  07/25/14 1214 09/16/14 0855  NA 137 139  K 4.8 4.4  CL 108 109  CO2 20 23  GLUCOSE 79 93  BUN 9 10  CALCIUM 8.4 8.5*  CREATININE 0.70 0.75  GFRNONAA >90 >60  GFRAA >90 >60    LIVER FUNCTION TESTS:  Recent Labs  07/25/14 1214 09/16/14 0855  BILITOT 0.5 0.3  AST 22 18  ALT 13 12*  ALKPHOS 70 66  PROT 7.6 7.6  ALBUMIN 3.4* 3.5    TUMOR MARKERS:  No results for input(s): AFPTM, CEA, CA199, CHROMGRNA in the last 8760 hours.  Assessment and Plan:  Ms. Annette Brooks is a 49 year old gravida 1 para 1 female with significant menorrhagia and uterine fibroids. She will undergo an MRI. Based on the MR, if she is a candidate, uterine artery embolization will be recommended.  Thank you for this interesting consult.  I greatly enjoyed meeting Annette Brooks and look forward to participating in their care.  Signed: Kahleel Fadeley, ART A 11/03/2014, 9:57 AM   I spent a total of  30 Minutes   in face to face in clinical consultation, greater than 50% of which was counseling/coordinating care for uterine fibroids.

## 2014-11-04 ENCOUNTER — Inpatient Hospital Stay (HOSPITAL_COMMUNITY)
Admission: AD | Admit: 2014-11-04 | Discharge: 2014-11-04 | Discharge status: Against Medical Advice | Payer: Managed Care, Other (non HMO) | Source: Ambulatory Visit | Attending: Obstetrics & Gynecology | Admitting: Obstetrics & Gynecology

## 2014-11-04 DIAGNOSIS — D649 Anemia, unspecified: Secondary | ICD-10-CM | POA: Diagnosis present

## 2014-11-04 DIAGNOSIS — Z5320 Procedure and treatment not carried out because of patient's decision for unspecified reasons: Secondary | ICD-10-CM | POA: Insufficient documentation

## 2014-11-04 NOTE — MAU Note (Signed)
Pt presents for scheduled IV iron infusion

## 2014-11-04 NOTE — MAU Note (Signed)
Pt will come back tomorrow. Unable to wait. Signed AMA paperwork

## 2014-11-05 ENCOUNTER — Encounter (HOSPITAL_COMMUNITY): Payer: Self-pay

## 2014-11-05 ENCOUNTER — Inpatient Hospital Stay (HOSPITAL_COMMUNITY)
Admission: AD | Admit: 2014-11-05 | Discharge: 2014-11-05 | Disposition: A | Discharge status: Home / Self Care | Payer: Managed Care, Other (non HMO) | Source: Ambulatory Visit | Attending: Obstetrics and Gynecology | Admitting: Obstetrics and Gynecology

## 2014-11-05 DIAGNOSIS — N921 Excessive and frequent menstruation with irregular cycle: Secondary | ICD-10-CM | POA: Diagnosis not present

## 2014-11-05 DIAGNOSIS — D259 Leiomyoma of uterus, unspecified: Secondary | ICD-10-CM | POA: Diagnosis not present

## 2014-11-05 DIAGNOSIS — D649 Anemia, unspecified: Secondary | ICD-10-CM | POA: Diagnosis present

## 2014-11-05 HISTORY — DX: Benign neoplasm of connective and other soft tissue, unspecified: D21.9

## 2014-11-05 MED ORDER — SODIUM CHLORIDE 0.9 % IV SOLN
510.0000 mg | Freq: Once | INTRAVENOUS | Status: AC
  Administered 2014-11-05: 510 mg via INTRAVENOUS
  Filled 2014-11-05: qty 17

## 2014-11-05 NOTE — MAU Note (Signed)
No adverse reaction noted.

## 2014-11-05 NOTE — MAU Note (Signed)
Pt here for IV infusion. Denies any complaints.

## 2014-11-13 ENCOUNTER — Ambulatory Visit
Admission: RE | Admit: 2014-11-13 | Discharge: 2014-11-13 | Disposition: A | Discharge status: Home / Self Care | Payer: Managed Care, Other (non HMO) | Source: Ambulatory Visit | Attending: Obstetrics and Gynecology | Admitting: Obstetrics and Gynecology

## 2014-11-13 DIAGNOSIS — D259 Leiomyoma of uterus, unspecified: Secondary | ICD-10-CM

## 2014-11-13 DIAGNOSIS — D219 Benign neoplasm of connective and other soft tissue, unspecified: Secondary | ICD-10-CM

## 2014-11-13 DIAGNOSIS — N921 Excessive and frequent menstruation with irregular cycle: Secondary | ICD-10-CM

## 2014-12-09 ENCOUNTER — Other Ambulatory Visit: Payer: Self-pay | Admitting: Radiology

## 2014-12-10 ENCOUNTER — Other Ambulatory Visit: Payer: Self-pay | Admitting: Interventional Radiology

## 2014-12-10 ENCOUNTER — Observation Stay (HOSPITAL_COMMUNITY)
Admission: RE | Admit: 2014-12-10 | Discharge: 2014-12-11 | Disposition: A | Discharge status: Home / Self Care | Payer: Managed Care, Other (non HMO) | Source: Ambulatory Visit | Attending: Interventional Radiology | Admitting: Interventional Radiology

## 2014-12-10 ENCOUNTER — Ambulatory Visit (HOSPITAL_COMMUNITY)
Admission: RE | Admit: 2014-12-10 | Discharge: 2014-12-10 | Disposition: A | Discharge status: Home / Self Care | Payer: Managed Care, Other (non HMO) | Source: Ambulatory Visit | Attending: Interventional Radiology | Admitting: Interventional Radiology

## 2014-12-10 ENCOUNTER — Encounter (HOSPITAL_COMMUNITY): Payer: Self-pay

## 2014-12-10 DIAGNOSIS — R350 Frequency of micturition: Secondary | ICD-10-CM | POA: Diagnosis not present

## 2014-12-10 DIAGNOSIS — D219 Benign neoplasm of connective and other soft tissue, unspecified: Secondary | ICD-10-CM

## 2014-12-10 DIAGNOSIS — K219 Gastro-esophageal reflux disease without esophagitis: Secondary | ICD-10-CM | POA: Diagnosis not present

## 2014-12-10 DIAGNOSIS — N920 Excessive and frequent menstruation with regular cycle: Secondary | ICD-10-CM | POA: Diagnosis not present

## 2014-12-10 DIAGNOSIS — D252 Subserosal leiomyoma of uterus: Secondary | ICD-10-CM | POA: Diagnosis not present

## 2014-12-10 DIAGNOSIS — D251 Intramural leiomyoma of uterus: Secondary | ICD-10-CM | POA: Diagnosis not present

## 2014-12-10 DIAGNOSIS — D259 Leiomyoma of uterus, unspecified: Secondary | ICD-10-CM | POA: Diagnosis present

## 2014-12-10 DIAGNOSIS — Z87891 Personal history of nicotine dependence: Secondary | ICD-10-CM | POA: Insufficient documentation

## 2014-12-10 DIAGNOSIS — Z9884 Bariatric surgery status: Secondary | ICD-10-CM | POA: Insufficient documentation

## 2014-12-10 LAB — BASIC METABOLIC PANEL
Anion gap: 8 (ref 5–15)
BUN: 12 mg/dL (ref 6–20)
CHLORIDE: 110 mmol/L (ref 101–111)
CO2: 20 mmol/L — AB (ref 22–32)
CREATININE: 0.51 mg/dL (ref 0.44–1.00)
Calcium: 8.7 mg/dL — ABNORMAL LOW (ref 8.9–10.3)
GFR calc Af Amer: >60 mL/min (ref >60)
GFR calc non Af Amer: >60 mL/min (ref >60)
Glucose, Bld: 88 mg/dL (ref 65–99)
Potassium: 4.2 mmol/L (ref 3.5–5.1)
SODIUM: 138 mmol/L (ref 135–145)

## 2014-12-10 LAB — CBC WITH DIFFERENTIAL/PLATELET
Basophils Absolute: 0 10*3/uL (ref 0.0–0.1)
Basophils Relative: 0 % (ref 0–1)
EOS ABS: 0.1 10*3/uL (ref 0.0–0.7)
EOS PCT: 2 % (ref 0–5)
HCT: 39.6 % (ref 36.0–46.0)
Hemoglobin: 12.7 g/dL (ref 12.0–15.0)
LYMPHS ABS: 1.9 10*3/uL (ref 0.7–4.0)
Lymphocytes Relative: 32 % (ref 12–46)
MCH: 30.2 pg (ref 26.0–34.0)
MCHC: 32.1 g/dL (ref 30.0–36.0)
MCV: 94.1 fL (ref 78.0–100.0)
MONOS PCT: 6 % (ref 3–12)
Monocytes Absolute: 0.4 10*3/uL (ref 0.1–1.0)
Neutro Abs: 3.7 10*3/uL (ref 1.7–7.7)
Neutrophils Relative %: 60 % (ref 43–77)
PLATELETS: 353 10*3/uL (ref 150–400)
RBC: 4.21 MIL/uL (ref 3.87–5.11)
RDW: 17.2 % — ABNORMAL HIGH (ref 11.5–15.5)
WBC: 6.1 10*3/uL (ref 4.0–10.5)

## 2014-12-10 LAB — PROTIME-INR
INR: 0.93 (ref 0.00–1.49)
PROTHROMBIN TIME: 12.7 s (ref 11.6–15.2)

## 2014-12-10 LAB — HCG, SERUM, QUALITATIVE: Preg, Serum: NEGATIVE

## 2014-12-10 MED ORDER — CHLORHEXIDINE GLUCONATE 0.12 % MT SOLN
15.0000 mL | Freq: Two times a day (BID) | OROMUCOSAL | Status: DC
  Administered 2014-12-10 – 2014-12-11 (×2): 15 mL via OROMUCOSAL
  Filled 2014-12-10 (×3): qty 15

## 2014-12-10 MED ORDER — FENTANYL CITRATE (PF) 100 MCG/2ML IJ SOLN
INTRAMUSCULAR | Status: AC | PRN
  Administered 2014-12-10: 25 ug via INTRAVENOUS
  Administered 2014-12-10: 50 ug via INTRAVENOUS
  Administered 2014-12-10: 25 ug via INTRAVENOUS

## 2014-12-10 MED ORDER — CEFAZOLIN SODIUM-DEXTROSE 2-3 GM-% IV SOLR
INTRAVENOUS | Status: AC
  Administered 2014-12-10: 2 g via INTRAVENOUS
  Filled 2014-12-10: qty 50

## 2014-12-10 MED ORDER — HYDROMORPHONE 0.3 MG/ML IV SOLN
INTRAVENOUS | Status: DC
  Administered 2014-12-10 – 2014-12-11 (×2): via INTRAVENOUS
  Administered 2014-12-11: 5.7 mg via INTRAVENOUS
  Filled 2014-12-10: qty 25

## 2014-12-10 MED ORDER — NALOXONE HCL 0.4 MG/ML IJ SOLN: 0.4000 mg | INTRAMUSCULAR | Status: DC | PRN

## 2014-12-10 MED ORDER — IOHEXOL 300 MG/ML  SOLN
50.0000 mL | Freq: Once | INTRAMUSCULAR | Status: DC | PRN
  Administered 2014-12-10: 50 mL via INTRA_ARTERIAL
  Filled 2014-12-10: qty 50

## 2014-12-10 MED ORDER — MIDAZOLAM HCL 2 MG/2ML IJ SOLN
INTRAMUSCULAR | Status: AC | PRN
  Administered 2014-12-10: 1 mg via INTRAVENOUS
  Administered 2014-12-10 (×4): 0.5 mg via INTRAVENOUS
  Administered 2014-12-10 (×3): 1 mg via INTRAVENOUS

## 2014-12-10 MED ORDER — NITROGLYCERIN 1 MG/10 ML FOR IR/CATH LAB
INTRA_ARTERIAL | Status: AC | PRN
  Administered 2014-12-10 (×3): 100 ug via INTRA_ARTERIAL

## 2014-12-10 MED ORDER — PROMETHAZINE HCL 25 MG PO TABS: 25.0000 mg | ORAL_TABLET | Freq: Three times a day (TID) | ORAL | Status: DC | PRN

## 2014-12-10 MED ORDER — HYDROMORPHONE 0.3 MG/ML IV SOLN
INTRAVENOUS | Status: AC
  Filled 2014-12-10: qty 25

## 2014-12-10 MED ORDER — CETYLPYRIDINIUM CHLORIDE 0.05 % MT LIQD: 7.0000 mL | Freq: Two times a day (BID) | OROMUCOSAL | Status: DC

## 2014-12-10 MED ORDER — IOHEXOL 300 MG/ML  SOLN
15.0000 mL | Freq: Once | INTRAMUSCULAR | Status: DC | PRN
  Administered 2014-12-10: 15 mL via INTRA_ARTERIAL
  Filled 2014-12-10: qty 20

## 2014-12-10 MED ORDER — DIPHENHYDRAMINE HCL 12.5 MG/5ML PO ELIX: 12.5000 mg | ORAL_SOLUTION | Freq: Four times a day (QID) | ORAL | Status: DC | PRN

## 2014-12-10 MED ORDER — HYDROMORPHONE HCL 1 MG/ML IJ SOLN
INTRAMUSCULAR | Status: AC | PRN
  Administered 2014-12-10: 1 mg via INTRAVENOUS
  Administered 2014-12-10 (×2): 0.5 mg via INTRAVENOUS

## 2014-12-10 MED ORDER — ONDANSETRON HCL 4 MG/2ML IJ SOLN: 4.0000 mg | Freq: Four times a day (QID) | INTRAMUSCULAR | Status: DC | PRN

## 2014-12-10 MED ORDER — HYDROMORPHONE HCL 2 MG/ML IJ SOLN
INTRAMUSCULAR | Status: AC
  Filled 2014-12-10: qty 1

## 2014-12-10 MED ORDER — IBUPROFEN 800 MG PO TABS
800.0000 mg | ORAL_TABLET | Freq: Four times a day (QID) | ORAL | Status: DC
  Administered 2014-12-10: 800 mg via ORAL
  Filled 2014-12-10 (×5): qty 1

## 2014-12-10 MED ORDER — FENTANYL CITRATE (PF) 100 MCG/2ML IJ SOLN
INTRAMUSCULAR | Status: AC
  Filled 2014-12-10: qty 4

## 2014-12-10 MED ORDER — DIPHENHYDRAMINE HCL 50 MG/ML IJ SOLN: 12.5000 mg | Freq: Four times a day (QID) | INTRAMUSCULAR | Status: DC | PRN

## 2014-12-10 MED ORDER — KETOROLAC TROMETHAMINE 30 MG/ML IJ SOLN
30.0000 mg | Freq: Once | INTRAMUSCULAR | Status: AC
  Administered 2014-12-10: 30 mg via INTRAVENOUS
  Filled 2014-12-10: qty 1

## 2014-12-10 MED ORDER — MIDAZOLAM HCL 2 MG/2ML IJ SOLN
INTRAMUSCULAR | Status: AC
  Filled 2014-12-10: qty 2

## 2014-12-10 MED ORDER — SODIUM CHLORIDE 0.9 % IV SOLN: 250.0000 mL | INTRAVENOUS | Status: DC | PRN

## 2014-12-10 MED ORDER — LIDOCAINE HCL 1 % IJ SOLN
INTRAMUSCULAR | Status: AC
  Filled 2014-12-10: qty 20

## 2014-12-10 MED ORDER — ONDANSETRON HCL 4 MG/2ML IJ SOLN
4.0000 mg | Freq: Four times a day (QID) | INTRAMUSCULAR | Status: DC | PRN
  Administered 2014-12-10 – 2014-12-11 (×2): 4 mg via INTRAVENOUS
  Filled 2014-12-10 (×2): qty 2

## 2014-12-10 MED ORDER — PROMETHAZINE HCL 25 MG RE SUPP: 25.0000 mg | Freq: Three times a day (TID) | RECTAL | Status: DC | PRN

## 2014-12-10 MED ORDER — SODIUM CHLORIDE 0.9 % IJ SOLN: 3.0000 mL | Freq: Two times a day (BID) | INTRAMUSCULAR | Status: DC

## 2014-12-10 MED ORDER — CEFAZOLIN SODIUM-DEXTROSE 2-3 GM-% IV SOLR
2.0000 g | Freq: Once | INTRAVENOUS | Status: AC
  Administered 2014-12-10: 2 g via INTRAVENOUS

## 2014-12-10 MED ORDER — SODIUM CHLORIDE 0.9 % IJ SOLN: 3.0000 mL | INTRAMUSCULAR | Status: DC | PRN

## 2014-12-10 MED ORDER — MIDAZOLAM HCL 2 MG/2ML IJ SOLN
INTRAMUSCULAR | Status: AC
  Filled 2014-12-10: qty 6

## 2014-12-10 MED ORDER — SODIUM CHLORIDE 0.9 % IV SOLN
INTRAVENOUS | Status: DC
  Administered 2014-12-10: 12:00:00 via INTRAVENOUS

## 2014-12-10 MED ORDER — SODIUM CHLORIDE 0.9 % IJ SOLN: 9.0000 mL | INTRAMUSCULAR | Status: DC | PRN

## 2014-12-10 MED ORDER — KETOROLAC TROMETHAMINE 30 MG/ML IJ SOLN
INTRAMUSCULAR | Status: AC
  Filled 2014-12-10: qty 1

## 2014-12-10 NOTE — H&P (Signed)
Chief Complaint: Patient was seen in consultation today for symptomatic fibroids at the request of Gustavo Lah  Referring Physician(s): Gustavo Lah  History of Present Illness: Annette Brooks is a 49 y.o. female with c/o menorrhagia and urinary frequency with uterine fibroids. She was seen in full IR consult on 11/03/14 and deemed a candidate for UFE. She has had a recent negative endometrial biopsy and MRI that has been reviewed. She denies any abdominal/pelvic pain today, she denies any nausea or vomiting. She denies any vaginal bleeding or dysuria today. She denies any chest pain, shortness of breath or palpitations. She denies any recent fever or chills. The patient denies any history of sleep apnea or chronic oxygen use. She has previously tolerated sedation without complications.    Past Medical History  Diagnosis Date  . Anxiety state, unspecified 02/16/2009  . ASTHMA 12/11/2006  . Bariatric surgery status 02/16/2009  . COLONIC POLYPS, HX OF 12/11/2006  . GERD 12/11/2006  . OBESITY 05/10/2008  . Acne   . HYPERTHYROIDISM 11/02/2009  . Hidradenitis suppurativa   . Anemia   . Allergy   . Fibroids     Past Surgical History  Procedure Laterality Date  . Gastric bypass    . Breast surgery      cysts  . Tubal ligation    . Myomectomy    . Pilonidal cyst excision    . Cholecystectomy N/A 09/21/2014    Procedure: LAPAROSCOPIC CHOLECYSTECTOMY WITH INTRAOPERATIVE CHOLANGIOGRAM WITH UPPER ENDOSCOPY;  Surgeon: Alphonsa Overall, MD;  Location: [facility];  Service: General;  Laterality: N/A;  . Dilate esophogus  2015    Allergies: Hydrocodone- back itching   Medications: Prior to Admission medications   Medication Sig Start Date End Date Taking? Authorizing Provider  acetaminophen (TYLENOL) 500 MG tablet Take 1,000 mg by mouth every 8 (eight) hours as needed for mild pain (legs cramps).    Yes Historical Provider, MD  albuterol (PROVENTIL HFA;VENTOLIN HFA) 108 (90 BASE) MCG/ACT  inhaler Inhale 2 puffs into the lungs every 6 (six) hours as needed for wheezing. 05/15/13  Yes Lisabeth Pick, MD  Calcium Citrate-Vitamin D (CALCIUM + D PO) Take 1 tablet by mouth every morning.    Yes Historical Provider, MD  Cyanocobalamin (VITAMIN B 12 PO) Take 1 tablet by mouth every morning.    Yes Historical Provider, MD  Ferrous Sulfate (IRON) 325 (65 FE) MG TABS Take 1 tablet by mouth at bedtime.    Yes Historical Provider, MD  GILDESS 1.5/30 1.5-30 MG-MCG tablet Take 1 tablet by mouth every morning.  07/02/14  Yes Historical Provider, MD  Multiple Vitamin (MULTIVITAMIN WITH MINERALS) TABS tablet Take 1 tablet by mouth every morning.    Yes Historical Provider, MD  pantoprazole (PROTONIX) 40 MG tablet Take 1 tablet (40 mg total) by mouth 2 (two) times daily. Patient taking differently: Take 40 mg by mouth daily.  10/12/13  Yes Kennyth Arnold, FNP  ranitidine (ZANTAC) 150 MG tablet Take 150 mg by mouth daily as needed for heartburn.    Yes Historical Provider, MD  senna (SENOKOT) 8.6 MG tablet Take 1 tablet by mouth daily.   Yes Historical Provider, MD  traZODone (DESYREL) 50 MG tablet Take 2 tablets (100 mg total) by mouth at bedtime as needed for sleep. Patient taking differently: Take 50-100 mg by mouth at bedtime as needed for sleep.  05/15/13  Yes Lisabeth Pick, MD     Family History  Problem Relation Age of  Onset  . Cancer      colon ca  . Stroke      family  . Hypertension Father   . Diabetes Father   . Hyperlipidemia Father   . Hypertension Mother   . Crohn's disease Sister   . Diabetes Sister     Social History   Social History  . Marital Status: Single    Spouse Name: N/A  . Number of Children: 1  . 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 Smoking status: Former Smoker    Quit date: 01/09/2002  . Smokeless tobacco: Never Used  . Alcohol Use: Yes     Comment: 3-4 beers daily   . Drug  Use: No  . Sexual Activity: Yes    Birth Control/ Protection: Surgical     Comment: BTL    Other Topics Concern  . None   Social History Narrative    Review of Systems: A 12 point ROS discussed and pertinent positives are indicated in the HPI above.  All other systems are negative.  Review of Systems  Vital Signs: BP 149/83 mmHg  Pulse 62  Temp(Src) 97.7 F (36.5 C) (Oral)  Resp 16  Ht 5\' 5"  (1.651 m)  Wt 205 lb (92.987 kg)  BMI 34.11 kg/m2  SpO2 99%  LMP 11/21/2014  Physical Exam  Constitutional: She is oriented to person, place, and time. No distress.  HENT:  Head: Normocephalic and atraumatic.  Neck: No tracheal deviation present.  Cardiovascular: Normal rate and regular rhythm.  Exam reveals no gallop and no friction rub.   No murmur heard. Pulmonary/Chest: Effort normal and breath sounds normal. No respiratory distress. She has no wheezes. She has no rales.  Abdominal: Soft. Bowel sounds are normal. She exhibits no distension. There is no tenderness.  Neurological: She is alert and oriented to person, place, and time.  Skin: She is not diaphoretic.    Mallampati Score:  MD Evaluation Airway: WNL Heart: WNL Abdomen: WNL Chest/ Lungs: WNL ASA  Classification: 2 Mallampati/Airway Score: One  Imaging: Mr Pelvis W Wo Contrast  11/13/2014   CLINICAL DATA:  Menorrhagia, uterine fibroids.  Pre [place] evaluation.  EXAM: MRI PELVIS WITHOUT AND WITH CONTRAST  TECHNIQUE: Multiplanar multisequence MR imaging of the pelvis was performed both before and after administration of intravenous contrast.  CONTRAST:  19 cc MultiHance IV contrast  COMPARISON:  CT abdomen/pelvis 11/14/2008. No prior ultrasound for comparison.  FINDINGS: Uterus is anteverted and anteflexed, measuring 9.7 x 8.4 x 6.7 cm, volume 287 cc. Multiple fibroids are noted with dominant lesions as follows:  Anterior uterine fundus, predominantly subserosal, 2.9 x 1.9 x 1.7 cm.  Left lateral uterine body,  intramural, 3.8 x 2.4 x 3.2 cm.  Right lateral low uterine body, predominantly subserosal, 2.0 x 1.9 x 1.8 cm.  There are numerous fibroids measuring 1.6 cm and smaller with partial intramural and partial submucosal components producing mass effect upon the endometrium.  Fibroids are predominantly T2 hypo intense. No T1 weighted hyperintensity to suggest hemorrhage/ red degeneration. After administration of contrast, there is fairly uniform enhancement of the largest uterine fibroids described above, with only a few areas of relative hypoenhancement in several predominantly intramural small fibroid. No abnormal enhancement within the pelvis is otherwise identified.  The ovaries are unremarkable. Trace pelvic fluid. Bladder is normal.  No acute osseous abnormality.  No lymphadenopathy.  IMPRESSION: Uterine fibroids as above, with  dominant and predominantly subserosal fibroids reported separately. There are numerous small intramural fibroids with submucosal components and mass effect upon the endometrium.   Electronically Signed   By: Conchita Paris M.D.   On: 11/13/2014 11:26    Labs:  CBC:  Recent Labs  07/25/14 1214 09/16/14 0855 12/10/14 1119  WBC 7.2 6.6 6.1  HGB 10.2* 9.7* 12.7  HCT 33.0* 31.8* 39.6  PLT 432* 427* 353    COAGS:  Recent Labs  12/10/14 1119  INR 0.93    BMP:  Recent Labs  07/25/14 1214 09/16/14 0855 12/10/14 1119  NA 137 139 138  K 4.8 4.4 4.2  CL 108 109 110  CO2 20 23 20*  GLUCOSE 79 93 88  BUN 9 10 12   CALCIUM 8.4 8.5* 8.7*  CREATININE 0.70 0.75 0.51  GFRNONAA >90 >60 >60  GFRAA >90 >60 >60    LIVER FUNCTION TESTS:  Recent Labs  07/25/14 1214 09/16/14 0855  BILITOT 0.5 0.3  AST 22 18  ALT 13 12*  ALKPHOS 70 66  PROT 7.6 7.6  ALBUMIN 3.4* 3.5    Assessment and Plan: Symptomatic uterine fibroids with menorrhagia and urinary frequency.  Seen in full IR consult 11/03/14 Scheduled today for image guided uterine fibroid embolization with  sedation Endometrial biopsy done 10/2014 negative for malignancy, MR reviewed Risks and Benefits discussed with the patient including, but not limited to post embolization syndrome, bleeding, infection, vascular injury or contrast induced renal failure. All of the patient's questions were answered, patient is agreeable to proceed. Consent signed and in chart. Anemia                                                                                                                                                                                                                                                Thank you for this interesting consult.  I greatly enjoyed meeting Annette Brooks and look forward to participating in their care.  A copy of this report was sent to the requesting provider on this date.  SignedHedy Jacob 12/10/2014, 1:17 PM

## 2014-12-10 NOTE — Procedures (Signed)
B [place] No comp/EBL

## 2014-12-10 NOTE — Sedation Documentation (Addendum)
5 Fr sheath removed from R femoral by Lambert Mody, RTR.  Hemostasis achieved using manual pressure.  Groin level 0, 3+RDP.

## 2014-12-10 NOTE — Sedation Documentation (Addendum)
Gauze/tegaderm bandage applied to R femoral artery puncture site.  Drsg CDI, groin level 0, 3+RDP.

## 2014-12-11 ENCOUNTER — Other Ambulatory Visit: Payer: Self-pay | Admitting: Radiology

## 2014-12-11 DIAGNOSIS — D259 Leiomyoma of uterus, unspecified: Secondary | ICD-10-CM

## 2014-12-11 DIAGNOSIS — D251 Intramural leiomyoma of uterus: Secondary | ICD-10-CM | POA: Diagnosis not present

## 2014-12-11 MED ORDER — ONDANSETRON HCL 4 MG PO TABS: 4.0000 mg | ORAL_TABLET | Freq: Three times a day (TID) | ORAL | Status: AC | PRN

## 2014-12-11 MED ORDER — DOCUSATE SODIUM 100 MG PO CAPS: 100.0000 mg | ORAL_CAPSULE | Freq: Two times a day (BID) | ORAL | Status: AC

## 2014-12-11 MED ORDER — KETOROLAC TROMETHAMINE 30 MG/ML IJ SOLN
30.0000 mg | Freq: Four times a day (QID) | INTRAMUSCULAR | Status: DC
  Administered 2014-12-11 (×2): 30 mg via INTRAVENOUS
  Filled 2014-12-11 (×6): qty 1

## 2014-12-11 MED ORDER — IBUPROFEN 600 MG PO TABS: 600.0000 mg | ORAL_TABLET | Freq: Four times a day (QID) | ORAL | Status: AC | PRN

## 2014-12-11 MED ORDER — METHYLPREDNISOLONE SODIUM SUCC 40 MG IJ SOLR
40.0000 mg | Freq: Once | INTRAMUSCULAR | Status: AC
  Administered 2014-12-11: 40 mg via INTRAVENOUS
  Filled 2014-12-11: qty 1

## 2014-12-11 MED ORDER — KETOROLAC TROMETHAMINE 30 MG/ML IJ SOLN: 30.0000 mg | Freq: Four times a day (QID) | INTRAMUSCULAR | Status: DC

## 2014-12-11 MED ORDER — OXYCODONE-ACETAMINOPHEN 5-325 MG PO TABS
1.0000 | ORAL_TABLET | ORAL | Status: DC | PRN
  Administered 2014-12-11: 1 via ORAL
  Filled 2014-12-11: qty 1

## 2014-12-11 MED ORDER — PANTOPRAZOLE SODIUM 40 MG IV SOLR
40.0000 mg | Freq: Every day | INTRAVENOUS | Status: DC
  Administered 2014-12-11: 40 mg via INTRAVENOUS
  Filled 2014-12-11 (×2): qty 40

## 2014-12-11 MED ORDER — OXYCODONE-ACETAMINOPHEN 5-325 MG PO TABS: 1.0000 | ORAL_TABLET | ORAL | Status: AC | PRN

## 2014-12-11 NOTE — Discharge Summary (Signed)
Patient ID: Annette Brooks MRN: [id number] DOB/AGE: 09-10-1965 49 y.o.  Admit date: 12/10/2014 Discharge date: 12/11/2014  Admission Diagnoses: Symptomatic uterine fibroids  Discharge Diagnoses:  Active Problems:   Fibroids S/p successful bilateral uterine artery embolization   Discharged Condition: Good, stable.   Hospital Course: Annette Brooks is a 49 y.o. female with c/o menorrhagia and urinary frequency with history of uterine fibroids. She was seen in full IR consult on 11/03/14 with Dr. Barbie Banner and deemed a candidate for UFE. She had a recent negative endometrial biopsy and MRI that has been reviewed.   She presented as an outpatient on 8/19 for elective uterine fibroid embolization and underwent successful bilateral uterine artery embolization without complications. She was admitted overnight for observation and pain control with stable vitals and is afebrile. She has had her foley catheter removed and urinated well without difficulty or dysuria. She has transitioned to oral pain medication and a regular diet and tolerated this well without nausea or vomiting. She has ambulated without lightheadedness or dizziness. She admits to 2/10 pelvic cramping, denies any vaginal spotting, denies any right groin access site pain, bleeding or swelling. She denies any chest pain or shortness of breath. She states she is ready for discharge home and is deemed medically stable to do so. She was given the below instructions and prescriptions and verbalizes understanding. She will follow up in IR clinic in 2-4 weeks.   Treatments: S/p successful bilateral uterine artery embolization   Discharge Exam: Blood pressure 166/94, pulse 54, temperature 97.7 F (36.5 C), temperature source Oral, resp. rate 14, height 5\' 5"  (1.651 m), weight 205 lb (92.987 kg), last menstrual period 11/21/2014, SpO2 99 %.  Physical Exam:  General: A&Ox3, NAD Heart: RRR without M/G/R Lungs: CTA b/l  Abd: Soft, ND, (+) BS,  TTP lower pelvic region  Ext: RCFA dressing C/D/I, soft, NT, no signs of infection, bleeding or hematoma, 3+ DP b/l, feet warm   Disposition: 01-Home or Self Care  Discharge Instructions    Call MD for:  difficulty breathing, headache or visual disturbances    Complete by:  As directed      Call MD for:  extreme fatigue    Complete by:  As directed      Call MD for:  hives    Complete by:  As directed      Call MD for:  persistant dizziness or light-headedness    Complete by:  As directed      Call MD for:  persistant nausea and vomiting    Complete by:  As directed      Call MD for:  redness, tenderness, or signs of infection (pain, swelling, redness, odor or green/yellow discharge around incision site)    Complete by:  As directed      Call MD for:  severe uncontrolled pain    Complete by:  As directed      Call MD for:  temperature >100.4    Complete by:  As directed      Diet - low sodium heart healthy    Complete by:  As directed      Driving Restrictions    Complete by:  As directed   No driving x 3 days     Increase activity slowly    Complete by:  As directed      Lifting restrictions    Complete by:  As directed   No lifting over 20 lbs x 1 week  Remove dressing in 24 hours    Complete by:  As directed             Medication List    TAKE these medications        acetaminophen 500 MG tablet  Commonly known as:  TYLENOL  Take 1,000 mg by mouth every 8 (eight) hours as needed for mild pain (legs cramps).     albuterol 108 (90 BASE) MCG/ACT inhaler  Commonly known as:  PROVENTIL HFA;VENTOLIN HFA  Inhale 2 puffs into the lungs every 6 (six) hours as needed for wheezing.     CALCIUM + D PO  Take 1 tablet by mouth every morning.     docusate sodium 100 MG capsule  Commonly known as:  COLACE  Take 1 capsule (100 mg total) by mouth 2 (two) times daily.     GILDESS 1.5/30 1.5-30 MG-MCG tablet  Generic drug:  Norethindrone Acetate-Ethinyl Estradiol  Take 1  tablet by mouth every morning.     ibuprofen 600 MG tablet  Commonly known as:  ADVIL,MOTRIN  Take 1 tablet (600 mg total) by mouth every 6 (six) hours as needed for moderate pain.     Iron 325 (65 FE) MG Tabs  Take 1 tablet by mouth at bedtime.     multivitamin with minerals Tabs tablet  Take 1 tablet by mouth every morning.     ondansetron 4 MG tablet  Commonly known as:  ZOFRAN  Take 1 tablet (4 mg total) by mouth every 8 (eight) hours as needed for nausea or vomiting.     oxyCODONE-acetaminophen 5-325 MG per tablet  Commonly known as:  PERCOCET/ROXICET  Take 1-2 tablets by mouth every 4 (four) hours as needed for moderate pain.     pantoprazole 40 MG tablet  Commonly known as:  PROTONIX  Take 1 tablet (40 mg total) by mouth 2 (two) times daily.     ranitidine 150 MG tablet  Commonly known as:  ZANTAC  Take 150 mg by mouth daily as needed for heartburn.     senna 8.6 MG tablet  Commonly known as:  SENOKOT  Take 1 tablet by mouth daily.     traZODone 50 MG tablet  Commonly known as:  DESYREL  Take 2 tablets (100 mg total) by mouth at bedtime as needed for sleep.     VITAMIN B 12 PO  Take 1 tablet by mouth every morning.           Follow-up Information    Follow up with HOSS, ART A, MD In 4 weeks.   Specialty:  Interventional Radiology   Why:  Our office will call patient with follow-up appointment [phone]   Contact information:   [address] [place] [address] [phone]        Signed: Hedy Jacob 12/11/2014, 10:06 AM   I have spent Greater Than 30 Minutes discharging Rhae Hammock.

## 2014-12-11 NOTE — Discharge Instructions (Signed)
Uterine Artery Embolization for Fibroids, Care After Refer to this sheet in the next few weeks. These instructions provide you with information on caring for yourself after your procedure. Your health care provider may also give you more specific instructions. Your treatment has been planned according to current medical practices, but problems sometimes occur. Call your health care provider if you have any problems or questions after your procedure. WHAT TO EXPECT AFTER THE PROCEDURE After your procedure, it is typical to have cramping in the pelvis. You will be given pain medicine to control it. HOME CARE INSTRUCTIONS  Only take over-the-counter or prescription medicines for pain, discomfort, or fever as directed by your health care provider.  Do not take aspirin. It can cause bleeding.  Follow your health care provider's advice regarding medicines given to you, diet, activity, and when to begin sexual activity.  See your health care provider for follow-up care as directed. SEEK MEDICAL CARE IF:  You have a fever.  You have redness, swelling, and pain around your incision site.  You have pus draining from your incision.  You have a rash. SEEK IMMEDIATE MEDICAL CARE IF:  You have bleeding from your incision site.  You have difficulty breathing.  You have chest pain.  You have severe abdominal pain.  You have leg pain.  You become dizzy and faint. Document Released: 01/28/2013 Document Reviewed: 01/28/2013 [facility] Patient Information 2015 [place], [place]. This information is not intended to replace advice given to you by your health care provider. Make sure you discuss any questions you have with your health care provider. Uterine Artery Embolization for Fibroids Uterine artery embolization is a nonsurgical treatment to shrink fibroids. A thin plastic tube (catheter) is used to inject material that blocks off the blood supply to the fibroid, which causes the fibroid to  shrink. LET [facility] CARE PROVIDER KNOW ABOUT:  Any allergies you have.  All medicines you are taking, including vitamins, herbs, eye drops, creams, and over-the-counter medicines.  Previous problems you or members of your family have had with the use of anesthetics.  Any blood disorders you have.  Previous surgeries you have had.  Medical conditions you have. RISKS AND COMPLICATIONS  Injury to the uterus from decreased blood supply  Infection.  Blood infection (septicemia).  Lack of menstrual periods (amenorrhea).  Death of tissue cells (necrosis) around your bladder or vulva.  Development of a hole between organs or from an organ to the surface of your skin (fistula).  Blood clot in the legs (deep vein thrombosis) or lung (pulmonary embolus). BEFORE THE PROCEDURE  Ask your health care provider about changing or stopping your regular medicines.   Do not take aspirin or blood thinners (anticoagulants) for 1 week before the surgery or as directed by your health care provider.  Do not eat or drink anything for 8 hours before the surgery or as directed by your health care provider.   Empty your bladder before the procedure begins. PROCEDURE   An IV tube will be placed into one of your veins. This will be used to give you a sedative and pain medication (conscious sedation).  You will be given a medicine that numbs the area (local anesthetic).  A small cut will be made in your groin. A catheter is then inserted into the main artery of your leg.  The catheter will be guided through the artery to your uterus. A series of images will be taken while dye is injected through the catheter in your  groin. X-rays are taken at the same time. This is done to provide a road map of the blood supply to your uterus and fibroids.  Tiny plastic spheres, about the size of sand grains, will be injected through the catheter. Metal coils may be used to help block the artery. The particles  will lodge in tiny branches of the uterine artery that supplies blood to the fibroids.  The procedure is repeated on the artery that supplies the other side of the uterus.  The catheter is then removed and pressure is held to stop any bleeding. No stitches are needed.  A dressing is then placed over the cut (incision). AFTER THE PROCEDURE  You will be taken to a recovery area where your progress will be monitored until you are awake, stable, and taking fluids well. If there are no other problems, you will then be moved to a regular hospital room.  You will be observed overnight in the hospital.  You will have cramping that should be controlled with pain medication. Document Released: 06/25/2005 Document Revised: 01/28/2013 Document Reviewed: 10/23/2012 [facility] Patient Information 2015 [place], [place]. This information is not intended to replace advice given to you by your health care provider. Make sure you discuss any questions you have with your health care provider. Arteriogram Care After These instructions give you information on caring for yourself after your procedure. Your doctor may also give you more specific instructions. Call your doctor if you have any problems or questions after your procedure. HOME CARE  Keep your leg straight for at least 6 hours.  Do not bathe, swim, or use a hot tub until directed by your doctor. You can shower.  Do not lift anything heavier than 10 pounds (about a gallon of milk) for 2 days.  Do not walk a lot, run, or drive for 2 days.  Return to normal activities in 2 days or as told by your doctor. Finding out the results of your test Ask when your test results will be ready. Make sure you get your test results. GET HELP RIGHT AWAY IF:   You have fever.  You have more pain in your leg.  The leg that was cut is:  Bleeding.  Puffy (swollen) or red.  Cold.  Pale or changes color.  Weak.  Tingly or numb. If you go to the Emergency  Room, tell your nurse that you have had an arteriogram. Take this paper with you to show the nurse. MAKE SURE YOU:  Understand these instructions.  Will watch your condition.  Will get help right away if you are not doing well or get worse. Document Released: 07/06/2008 Document Revised: 04/14/2013 Document Reviewed: 07/06/2008 [facility] Patient Information 2015 [place], [place]. This information is not intended to replace advice given to you by your health care provider. Make sure you discuss any questions you have with your health care provider.  Fibroids Fibroids are lumps (tumors) that can occur any place in a woman's body. These lumps are not cancerous. Fibroids vary in size, weight, and where they grow. HOME CARE  Do not take aspirin.  Write down the number of pads or tampons you use during your period. Tell your doctor. This can help determine the best treatment for you. GET HELP RIGHT AWAY IF:  You have pain in your lower belly (abdomen) that is not helped with medicine.  You have cramps that are not helped with medicine.  You have more bleeding between or during your period.  You feel  lightheaded or pass out (faint).  Your lower belly pain gets worse. MAKE SURE YOU:  Understand these instructions.  Will watch your condition.  Will get help right away if you are not doing well or get worse. Document Released: 05/12/2010 Document Revised: 07/02/2011 Document Reviewed: 05/12/2010 [facility] Patient Information 2015 [place], [place]. This information is not intended to replace advice given to you by your health care provider. Make sure you discuss any questions you have with your health care provider.

## 2014-12-11 NOTE — Progress Notes (Signed)
Went over d/c instructions with patient and daughter.  Both verbalized understanding.  Left via w/c and personal vehicle with belongings.

## 2014-12-13 ENCOUNTER — Other Ambulatory Visit (HOSPITAL_COMMUNITY): Payer: Self-pay | Admitting: Interventional Radiology

## 2014-12-13 ENCOUNTER — Other Ambulatory Visit: Payer: Self-pay | Admitting: Interventional Radiology

## 2014-12-13 DIAGNOSIS — D259 Leiomyoma of uterus, unspecified: Secondary | ICD-10-CM

## 2014-12-14 ENCOUNTER — Ambulatory Visit
Admission: RE | Admit: 2014-12-14 | Discharge: 2014-12-14 | Disposition: A | Discharge status: Home / Self Care | Payer: Managed Care, Other (non HMO) | Source: Ambulatory Visit | Attending: Interventional Radiology | Admitting: Interventional Radiology

## 2014-12-14 DIAGNOSIS — Z9889 Other specified postprocedural states: Secondary | ICD-10-CM | POA: Insufficient documentation

## 2014-12-14 DIAGNOSIS — D259 Leiomyoma of uterus, unspecified: Secondary | ICD-10-CM

## 2014-12-14 DIAGNOSIS — G8918 Other acute postprocedural pain: Secondary | ICD-10-CM | POA: Insufficient documentation

## 2014-12-14 NOTE — Progress Notes (Signed)
Chief Complaint:   I had my fibroid treated, and I am having abdominal pain.   Referring Physician(s): Dr. Toni Amend Hoss, VIR.   History of Present Illness: Annette Brooks is a 49 y.o. female presenting today for evaluation of abdominal pain, postoperative day for of bilateral uterine artery embolization performed for treatment of symptomatic uterine fibroids.  [name] had her procedure performed on Friday, 12/10/2014. She was discharged home from [facility] on 12/11/2014. She reports that on the day of her discharge she was having 8/10 pain in her abdomen and lower back, which was expected after her procedure. She was satisfied to recover at home with prescribed medications. Since this time on Saturday, she reports that the pain has in fact improved, and is currently, at its worst, only 6/10 in intensity. The pain is located in the low back in the region of her tailbone, and in the front just over her pubic bone. She does report that the prescribed medications of oxycodone and ibuprofen have been adequate for generally and traveling the pain.  In addition to low abdominal/pelvic pain, she does report a mild vaginal discharge of brown in color, which has an odd odor though she is not concerned with infection. She has had no bleeding since the procedure.  She describes some drainage at the puncture site of the right common femoral artery.  She has had no spiking fevers. She denies rigors or chills. She has had some nausea at home, and has required using the ondansetron prescription. She does describe some constipation at this time, though she has been able to move her bowels normally. Her appetite has not yet returned to normal.  Past Medical History  Diagnosis Date  . Anxiety state, unspecified 02/16/2009  . ASTHMA 12/11/2006  . Bariatric surgery status 02/16/2009  . COLONIC POLYPS, HX OF 12/11/2006  . GERD 12/11/2006  . OBESITY 05/10/2008  . Acne   . HYPERTHYROIDISM 11/02/2009    . Hidradenitis suppurativa   . Anemia   . Allergy   . Fibroids     Past Surgical History  Procedure Laterality Date  . Gastric bypass    . Breast surgery      cysts  . Tubal ligation    . Myomectomy    . Pilonidal cyst excision    . Cholecystectomy N/A 09/21/2014    Procedure: LAPAROSCOPIC CHOLECYSTECTOMY WITH INTRAOPERATIVE CHOLANGIOGRAM WITH UPPER ENDOSCOPY;  Surgeon: Alphonsa Overall, MD;  Location: [facility];  Service: General;  Laterality: N/A;  . Dilate esophogus  2015    Allergies: Hydrocodone  Medications: Prior to Admission medications   Medication Sig Start Date End Date Taking? Authorizing Provider  acetaminophen (TYLENOL) 500 MG tablet Take 1,000 mg by mouth every 8 (eight) hours as needed for mild pain (legs cramps).    Yes Historical Provider, MD  albuterol (PROVENTIL HFA;VENTOLIN HFA) 108 (90 BASE) MCG/ACT inhaler Inhale 2 puffs into the lungs every 6 (six) hours as needed for wheezing. 05/15/13  Yes Lisabeth Pick, MD  Calcium Citrate-Vitamin D (CALCIUM + D PO) Take 1 tablet by mouth every morning.    Yes Historical Provider, MD  Cyanocobalamin (VITAMIN B 12 PO) Take 1 tablet by mouth every morning.    Yes Historical Provider, MD  docusate sodium (COLACE) 100 MG capsule Take 1 capsule (100 mg total) by mouth 2 (two) times daily. 12/11/14  Yes Hedy Jacob, PA-C  Ferrous Sulfate (IRON) 325 (65 FE) MG TABS Take 1 tablet by mouth  at bedtime.    Yes Historical Provider, MD  GILDESS 1.5/30 1.5-30 MG-MCG tablet Take 1 tablet by mouth every morning.  07/02/14  Yes Historical Provider, MD  ibuprofen (ADVIL,MOTRIN) 600 MG tablet Take 1 tablet (600 mg total) by mouth every 6 (six) hours as needed for moderate pain. 12/11/14  Yes Hedy Jacob, PA-C  Multiple Vitamin (MULTIVITAMIN WITH MINERALS) TABS tablet Take 1 tablet by mouth every morning.    Yes Historical Provider, MD  ondansetron (ZOFRAN) 4 MG tablet Take 1 tablet (4 mg total) by mouth every 8 (eight) hours as needed for  nausea or vomiting. 12/11/14  Yes Hedy Jacob, PA-C  oxyCODONE-acetaminophen (PERCOCET/ROXICET) 5-325 MG per tablet Take 1-2 tablets by mouth every 4 (four) hours as needed for moderate pain. 12/11/14  Yes Hedy Jacob, PA-C  pantoprazole (PROTONIX) 40 MG tablet Take 1 tablet (40 mg total) by mouth 2 (two) times daily. Patient taking differently: Take 40 mg by mouth daily.  10/12/13  Yes Kennyth Arnold, FNP  ranitidine (ZANTAC) 150 MG tablet Take 150 mg by mouth daily as needed for heartburn.    Yes Historical Provider, MD  senna (SENOKOT) 8.6 MG tablet Take 1 tablet by mouth daily.   Yes Historical Provider, MD  traZODone (DESYREL) 50 MG tablet Take 2 tablets (100 mg total) by mouth at bedtime as needed for sleep. Patient not taking: Reported on 12/14/2014 05/15/13   Lisabeth Pick, MD     Family History  Problem Relation Age of Onset  . Cancer      colon ca  . Stroke      family  . Hypertension Father   . Diabetes Father   . Hyperlipidemia Father   . Hypertension Mother   . Crohn's disease Sister   . Diabetes Sister     Social History   Social History  . Marital Status: Single    Spouse Name: N/A  . Number of Children: 1  . 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 Smoking status: Former Smoker    Quit date: 01/09/2002  . Smokeless tobacco: Never Used  . Alcohol Use: Yes     Comment: 3-4 beers daily   . Drug Use: No  . Sexual Activity: Yes    Birth Control/ Protection: Surgical     Comment: BTL    Other Topics Concern  . Not on file   Social History Narrative     Review of Systems: A 12 point ROS discussed and pertinent positives are indicated in the HPI above.  All other systems are negative.  Review of Systems  Vital Signs: BP 144/74 mmHg  Pulse 69  Temp(Src) 97.8 F (36.6 C) (Oral)  Resp 15  Ht 5\' 5"  (1.651 m)  Wt 205 lb (92.987 kg)  BMI 34.11 kg/m2  SpO2 97%  LMP  11/21/2014  Physical Exam   Atraumatic normocephalic mucous membranes moist and pink. She is wearing glasses. Conjugate gaze. No icterus or scleral injection. Lungs are clear to auscultation. Regular rate and rhythm. No muscle guarding or rigidity. Genitourinary is deferred. Investigation of the right inguinal region demonstrates regrowth of the hair that was shaved for her procedure. There are several follicles which appear irritated and red. No drainage. No skin erythema or warmth. The puncture site at the right common femoral artery is well healed with no fluid collection or evidence of pseudoaneurysm. No hematoma.  Imaging: Ir Angiogram Pelvis Selective Or Supraselective  12/10/2014   CLINICAL DATA:  Symptomatic uterine fibroids  EXAM: UTERINE ARTERY EMBOLIZATION  FLUOROSCOPY TIME:  Thirty-seven minutes and 0 seconds  MEDICATIONS AND MEDICAL HISTORY: Versed 6 mg, Fentanyl 100 mcg.  Additional Medications: Dilaudid 2 mg IV. Toward all 30 mg IV. Nitroglycerin 300 mcg intra arterial.  Allergies: Hydrocodone  ANESTHESIA/SEDATION: Moderate sedation time: 107 minutes  CONTRAST:  120 cc Omnipaque 300  PROCEDURE: Vessels selected:  Third order bilateral iliac artery.  The procedure, risks, benefits, and alternatives were explained to the patient. Questions regarding the procedure were encouraged and answered. The patient understands and consents to the procedure.  The right groin was prepped with Betadine in a sterile fashion, and a sterile drape was applied covering the operative field. A sterile gown and sterile gloves were used for the procedure.  A micropuncture wall needle was inserted into the right common femoral artery and removed over a 018 wire which was up sized to a [place]. A 5-French sheath was inserted. Cobra II catheter was advanced into the aorta. The contralateral left common iliac artery was selected. The internal iliac artery on the left was selected. Angiography was performed. A  micro catheter was advanced over an 018 glide wire into the left uterine artery. Angiography was performed. 100 mcg nitroglycerin was injected for spasm.  Embolization was performed utilizing1-2/3 vial 500-700 micron embospheres.  The micro catheter was removed and flushed. A Waltman's loop was created. The ipsilateral right common iliac artery and right internal iliac artery were selected. Angiography was performed. A micro catheter was advanced over a 0018 glide wire into the right uterine artery. Angiography was performed. 200 mcg nitroglycerin was injected for spasm.  Embolization was again performed with1 vial 500-700 micron embospheres.  The micro catheter and Cobra catheter were removed. Right femoral angiography was performed. The sheath was removed and hemostasis was achieved with direct pressure.  COMPLICATIONS: None  FINDINGS: Imaging demonstrates bilateral pelvic anatomy and bilateral uterine artery angiography. Expected anatomy is identified. No blood supply from the uterine artery to the vagina is visualized.  Post embolization angiography demonstrates relative stasis of flow and pruning of the vasculature to the uterus.  IMPRESSION: Successful bilateral uterine artery embolization.   Electronically Signed   By: Marybelle Killings M.D.   On: 12/10/2014 16:51   Ir Angiogram Pelvis Selective Or Supraselective  12/10/2014   CLINICAL DATA:  Symptomatic uterine fibroids  EXAM: UTERINE ARTERY EMBOLIZATION  FLUOROSCOPY TIME:  Thirty-seven minutes and 0 seconds  MEDICATIONS AND MEDICAL HISTORY: Versed 6 mg, Fentanyl 100 mcg.  Additional Medications: Dilaudid 2 mg IV. Toward all 30 mg IV. Nitroglycerin 300 mcg intra arterial.  Allergies: Hydrocodone  ANESTHESIA/SEDATION: Moderate sedation time: 107 minutes  CONTRAST:  120 cc Omnipaque 300  PROCEDURE: Vessels selected:  Third order bilateral iliac artery.  The procedure, risks, benefits, and alternatives were explained to the patient. Questions regarding the  procedure were encouraged and answered. The patient understands and consents to the procedure.  The right groin was prepped with Betadine in a sterile fashion, and a sterile drape was applied covering the operative field. A sterile gown and sterile gloves were used for the procedure.  A micropuncture wall needle was inserted into the right common femoral artery and removed over a 018 wire which was up sized to a [place]. A 5-French sheath was inserted. Cobra II catheter was advanced into the aorta. The contralateral left common iliac artery was selected. The internal iliac  artery on the left was selected. Angiography was performed. A micro catheter was advanced over an 018 glide wire into the left uterine artery. Angiography was performed. 100 mcg nitroglycerin was injected for spasm.  Embolization was performed utilizing1-2/3 vial 500-700 micron embospheres.  The micro catheter was removed and flushed. A Waltman's loop was created. The ipsilateral right common iliac artery and right internal iliac artery were selected. Angiography was performed. A micro catheter was advanced over a 0018 glide wire into the right uterine artery. Angiography was performed. 200 mcg nitroglycerin was injected for spasm.  Embolization was again performed with1 vial 500-700 micron embospheres.  The micro catheter and Cobra catheter were removed. Right femoral angiography was performed. The sheath was removed and hemostasis was achieved with direct pressure.  COMPLICATIONS: None  FINDINGS: Imaging demonstrates bilateral pelvic anatomy and bilateral uterine artery angiography. Expected anatomy is identified. No blood supply from the uterine artery to the vagina is visualized.  Post embolization angiography demonstrates relative stasis of flow and pruning of the vasculature to the uterus.  IMPRESSION: Successful bilateral uterine artery embolization.   Electronically Signed   By: Marybelle Killings M.D.   On: 12/10/2014 16:51   Ir Angiogram  Selective Each Additional Vessel  12/10/2014   CLINICAL DATA:  Symptomatic uterine fibroids  EXAM: UTERINE ARTERY EMBOLIZATION  FLUOROSCOPY TIME:  Thirty-seven minutes and 0 seconds  MEDICATIONS AND MEDICAL HISTORY: Versed 6 mg, Fentanyl 100 mcg.  Additional Medications: Dilaudid 2 mg IV. Toward all 30 mg IV. Nitroglycerin 300 mcg intra arterial.  Allergies: Hydrocodone  ANESTHESIA/SEDATION: Moderate sedation time: 107 minutes  CONTRAST:  120 cc Omnipaque 300  PROCEDURE: Vessels selected:  Third order bilateral iliac artery.  The procedure, risks, benefits, and alternatives were explained to the patient. Questions regarding the procedure were encouraged and answered. The patient understands and consents to the procedure.  The right groin was prepped with Betadine in a sterile fashion, and a sterile drape was applied covering the operative field. A sterile gown and sterile gloves were used for the procedure.  A micropuncture wall needle was inserted into the right common femoral artery and removed over a 018 wire which was up sized to a [place]. A 5-French sheath was inserted. Cobra II catheter was advanced into the aorta. The contralateral left common iliac artery was selected. The internal iliac artery on the left was selected. Angiography was performed. A micro catheter was advanced over an 018 glide wire into the left uterine artery. Angiography was performed. 100 mcg nitroglycerin was injected for spasm.  Embolization was performed utilizing1-2/3 vial 500-700 micron embospheres.  The micro catheter was removed and flushed. A Waltman's loop was created. The ipsilateral right common iliac artery and right internal iliac artery were selected. Angiography was performed. A micro catheter was advanced over a 0018 glide wire into the right uterine artery. Angiography was performed. 200 mcg nitroglycerin was injected for spasm.  Embolization was again performed with1 vial 500-700 micron embospheres.  The micro catheter  and Cobra catheter were removed. Right femoral angiography was performed. The sheath was removed and hemostasis was achieved with direct pressure.  COMPLICATIONS: None  FINDINGS: Imaging demonstrates bilateral pelvic anatomy and bilateral uterine artery angiography. Expected anatomy is identified. No blood supply from the uterine artery to the vagina is visualized.  Post embolization angiography demonstrates relative stasis of flow and pruning of the vasculature to the uterus.  IMPRESSION: Successful bilateral uterine artery embolization.   Electronically Signed   By: Marybelle Killings  M.D.   On: 12/10/2014 16:51   Ir Angiogram Selective Each Additional Vessel  12/10/2014   CLINICAL DATA:  Symptomatic uterine fibroids  EXAM: UTERINE ARTERY EMBOLIZATION  FLUOROSCOPY TIME:  Thirty-seven minutes and 0 seconds  MEDICATIONS AND MEDICAL HISTORY: Versed 6 mg, Fentanyl 100 mcg.  Additional Medications: Dilaudid 2 mg IV. Toward all 30 mg IV. Nitroglycerin 300 mcg intra arterial.  Allergies: Hydrocodone  ANESTHESIA/SEDATION: Moderate sedation time: 107 minutes  CONTRAST:  120 cc Omnipaque 300  PROCEDURE: Vessels selected:  Third order bilateral iliac artery.  The procedure, risks, benefits, and alternatives were explained to the patient. Questions regarding the procedure were encouraged and answered. The patient understands and consents to the procedure.  The right groin was prepped with Betadine in a sterile fashion, and a sterile drape was applied covering the operative field. A sterile gown and sterile gloves were used for the procedure.  A micropuncture wall needle was inserted into the right common femoral artery and removed over a 018 wire which was up sized to a [place]. A 5-French sheath was inserted. Cobra II catheter was advanced into the aorta. The contralateral left common iliac artery was selected. The internal iliac artery on the left was selected. Angiography was performed. A micro catheter was advanced over an  018 glide wire into the left uterine artery. Angiography was performed. 100 mcg nitroglycerin was injected for spasm.  Embolization was performed utilizing1-2/3 vial 500-700 micron embospheres.  The micro catheter was removed and flushed. A Waltman's loop was created. The ipsilateral right common iliac artery and right internal iliac artery were selected. Angiography was performed. A micro catheter was advanced over a 0018 glide wire into the right uterine artery. Angiography was performed. 200 mcg nitroglycerin was injected for spasm.  Embolization was again performed with1 vial 500-700 micron embospheres.  The micro catheter and Cobra catheter were removed. Right femoral angiography was performed. The sheath was removed and hemostasis was achieved with direct pressure.  COMPLICATIONS: None  FINDINGS: Imaging demonstrates bilateral pelvic anatomy and bilateral uterine artery angiography. Expected anatomy is identified. No blood supply from the uterine artery to the vagina is visualized.  Post embolization angiography demonstrates relative stasis of flow and pruning of the vasculature to the uterus.  IMPRESSION: Successful bilateral uterine artery embolization.   Electronically Signed   By: Marybelle Killings M.D.   On: 12/10/2014 16:51   Ir 3d Primitivo Gauze Darreld Mclean  12/10/2014   CLINICAL DATA:  Symptomatic uterine fibroids  EXAM: UTERINE ARTERY EMBOLIZATION  FLUOROSCOPY TIME:  Thirty-seven minutes and 0 seconds  MEDICATIONS AND MEDICAL HISTORY: Versed 6 mg, Fentanyl 100 mcg.  Additional Medications: Dilaudid 2 mg IV. Toward all 30 mg IV. Nitroglycerin 300 mcg intra arterial.  Allergies: Hydrocodone  ANESTHESIA/SEDATION: Moderate sedation time: 107 minutes  CONTRAST:  120 cc Omnipaque 300  PROCEDURE: Vessels selected:  Third order bilateral iliac artery.  The procedure, risks, benefits, and alternatives were explained to the patient. Questions regarding the procedure were encouraged and answered. The patient understands and  consents to the procedure.  The right groin was prepped with Betadine in a sterile fashion, and a sterile drape was applied covering the operative field. A sterile gown and sterile gloves were used for the procedure.  A micropuncture wall needle was inserted into the right common femoral artery and removed over a 018 wire which was up sized to a [place]. A 5-French sheath was inserted. Cobra II catheter was advanced into the aorta. The contralateral left common iliac artery  was selected. The internal iliac artery on the left was selected. Angiography was performed. A micro catheter was advanced over an 018 glide wire into the left uterine artery. Angiography was performed. 100 mcg nitroglycerin was injected for spasm.  Embolization was performed utilizing1-2/3 vial 500-700 micron embospheres.  The micro catheter was removed and flushed. A Waltman's loop was created. The ipsilateral right common iliac artery and right internal iliac artery were selected. Angiography was performed. A micro catheter was advanced over a 0018 glide wire into the right uterine artery. Angiography was performed. 200 mcg nitroglycerin was injected for spasm.  Embolization was again performed with1 vial 500-700 micron embospheres.  The micro catheter and Cobra catheter were removed. Right femoral angiography was performed. The sheath was removed and hemostasis was achieved with direct pressure.  COMPLICATIONS: None  FINDINGS: Imaging demonstrates bilateral pelvic anatomy and bilateral uterine artery angiography. Expected anatomy is identified. No blood supply from the uterine artery to the vagina is visualized.  Post embolization angiography demonstrates relative stasis of flow and pruning of the vasculature to the uterus.  IMPRESSION: Successful bilateral uterine artery embolization.   Electronically Signed   By: Marybelle Killings M.D.   On: 12/10/2014 16:51   Ir US Guide Vasc Access Right  12/10/2014   CLINICAL DATA:  Symptomatic uterine  fibroids  EXAM: UTERINE ARTERY EMBOLIZATION  FLUOROSCOPY TIME:  Thirty-seven minutes and 0 seconds  MEDICATIONS AND MEDICAL HISTORY: Versed 6 mg, Fentanyl 100 mcg.  Additional Medications: Dilaudid 2 mg IV. Toward all 30 mg IV. Nitroglycerin 300 mcg intra arterial.  Allergies: Hydrocodone  ANESTHESIA/SEDATION: Moderate sedation time: 107 minutes  CONTRAST:  120 cc Omnipaque 300  PROCEDURE: Vessels selected:  Third order bilateral iliac artery.  The procedure, risks, benefits, and alternatives were explained to the patient. Questions regarding the procedure were encouraged and answered. The patient understands and consents to the procedure.  The right groin was prepped with Betadine in a sterile fashion, and a sterile drape was applied covering the operative field. A sterile gown and sterile gloves were used for the procedure.  A micropuncture wall needle was inserted into the right common femoral artery and removed over a 018 wire which was up sized to a [place]. A 5-French sheath was inserted. Cobra II catheter was advanced into the aorta. The contralateral left common iliac artery was selected. The internal iliac artery on the left was selected. Angiography was performed. A micro catheter was advanced over an 018 glide wire into the left uterine artery. Angiography was performed. 100 mcg nitroglycerin was injected for spasm.  Embolization was performed utilizing1-2/3 vial 500-700 micron embospheres.  The micro catheter was removed and flushed. A Waltman's loop was created. The ipsilateral right common iliac artery and right internal iliac artery were selected. Angiography was performed. A micro catheter was advanced over a 0018 glide wire into the right uterine artery. Angiography was performed. 200 mcg nitroglycerin was injected for spasm.  Embolization was again performed with1 vial 500-700 micron embospheres.  The micro catheter and Cobra catheter were removed. Right femoral angiography was performed. The sheath  was removed and hemostasis was achieved with direct pressure.  COMPLICATIONS: None  FINDINGS: Imaging demonstrates bilateral pelvic anatomy and bilateral uterine artery angiography. Expected anatomy is identified. No blood supply from the uterine artery to the vagina is visualized.  Post embolization angiography demonstrates relative stasis of flow and pruning of the vasculature to the uterus.  IMPRESSION: Successful bilateral uterine artery embolization.   Electronically Signed  By: Marybelle Killings M.D.   On: 12/10/2014 16:51   Ir Embo Tumor Organ Ischemia Infarct Inc Guide [employer]  12/10/2014   CLINICAL DATA:  Symptomatic uterine fibroids  EXAM: UTERINE ARTERY EMBOLIZATION  FLUOROSCOPY TIME:  Thirty-seven minutes and 0 seconds  MEDICATIONS AND MEDICAL HISTORY: Versed 6 mg, Fentanyl 100 mcg.  Additional Medications: Dilaudid 2 mg IV. Toward all 30 mg IV. Nitroglycerin 300 mcg intra arterial.  Allergies: Hydrocodone  ANESTHESIA/SEDATION: Moderate sedation time: 107 minutes  CONTRAST:  120 cc Omnipaque 300  PROCEDURE: Vessels selected:  Third order bilateral iliac artery.  The procedure, risks, benefits, and alternatives were explained to the patient. Questions regarding the procedure were encouraged and answered. The patient understands and consents to the procedure.  The right groin was prepped with Betadine in a sterile fashion, and a sterile drape was applied covering the operative field. A sterile gown and sterile gloves were used for the procedure.  A micropuncture wall needle was inserted into the right common femoral artery and removed over a 018 wire which was up sized to a [place]. A 5-French sheath was inserted. Cobra II catheter was advanced into the aorta. The contralateral left common iliac artery was selected. The internal iliac artery on the left was selected. Angiography was performed. A micro catheter was advanced over an 018 glide wire into the left uterine artery. Angiography was performed.  100 mcg nitroglycerin was injected for spasm.  Embolization was performed utilizing1-2/3 vial 500-700 micron embospheres.  The micro catheter was removed and flushed. A Waltman's loop was created. The ipsilateral right common iliac artery and right internal iliac artery were selected. Angiography was performed. A micro catheter was advanced over a 0018 glide wire into the right uterine artery. Angiography was performed. 200 mcg nitroglycerin was injected for spasm.  Embolization was again performed with1 vial 500-700 micron embospheres.  The micro catheter and Cobra catheter were removed. Right femoral angiography was performed. The sheath was removed and hemostasis was achieved with direct pressure.  COMPLICATIONS: None  FINDINGS: Imaging demonstrates bilateral pelvic anatomy and bilateral uterine artery angiography. Expected anatomy is identified. No blood supply from the uterine artery to the vagina is visualized.  Post embolization angiography demonstrates relative stasis of flow and pruning of the vasculature to the uterus.  IMPRESSION: Successful bilateral uterine artery embolization.   Electronically Signed   By: Marybelle Killings M.D.   On: 12/10/2014 16:51    Labs:  CBC:  Recent Labs  07/25/14 1214 09/16/14 0855 12/10/14 1119  WBC 7.2 6.6 6.1  HGB 10.2* 9.7* 12.7  HCT 33.0* 31.8* 39.6  PLT 432* 427* 353    COAGS:  Recent Labs  12/10/14 1119  INR 0.93    BMP:  Recent Labs  07/25/14 1214 09/16/14 0855 12/10/14 1119  NA 137 139 138  K 4.8 4.4 4.2  CL 108 109 110  CO2 20 23 20*  GLUCOSE 79 93 88  BUN 9 10 12   CALCIUM 8.4 8.5* 8.7*  CREATININE 0.70 0.75 0.51  GFRNONAA >90 >60 >60  GFRAA >90 >60 >60    LIVER FUNCTION TESTS:  Recent Labs  07/25/14 1214 09/16/14 0855  BILITOT 0.5 0.3  AST 22 18  ALT 13 12*  ALKPHOS 70 66  PROT 7.6 7.6  ALBUMIN 3.4* 3.5    TUMOR MARKERS: No results for input(s): AFPTM, CEA, CA199, CHROMGRNA in the last 8760 hours.  Assessment  and Plan:  Ms. Taha is a 49 year old female who is postoperative day  for status post bilateral uterine artery embolization for treatment of symptomatic uterine fibroids. She returns to the clinic today seeking consultation for lower abdominal pain and back pain.   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 The time course of her symptoms, including the pattern of improvement fits well with the known time course of post embolization syndrome. At this point, 4 days after the procedure, she is improving, and should see more improvement by a week with the majority of symptoms improved by that point. I have no concern at this point for an infection either at the puncture site or of a uterine infection/endometritis.    I also discussed the changes at the puncture site, which appear to be related to irritation at the hair follicles related to the preoperative shaving. I do not have a concern for an infection at the operative site. I encouraged her to keep the area clean and dry.  I revisited the schedule of her prescribed medications, encouraging her to take the ibuprofen as prescribed, and start decreasing the amount of the oxycodone, which she had been taking as a 2 tablet dose. I encouraged her to take 1 tablet oxycodone as breakthrough as needed.  She is doing a good job with hydration and ambulation, which I encouraged her to continue. Hopefully her appetite will return with the decreased use of the narcotic medication.  I answered all of her questions to the best of my ability. She seems satisfied with the plan of care, and will call should she have any further concerns.  We will plan on a revisit to the clinic at 1 month postop.    SignedCorrie Mckusick 12/14/2014, 4:13 PM   I spent a total of    15 Minutes in face to face in clinical consultation, greater than 50% of which was  counseling/coordinating care for postoperative care, status post uterine artery embolization for symptomatic uterine fibroids, and postoperative abdominal pain.

## 2014-12-28 ENCOUNTER — Inpatient Hospital Stay: Admission: RE | Admit: 2014-12-28 | Payer: Managed Care, Other (non HMO) | Source: Ambulatory Visit

## 2015-02-03 ENCOUNTER — Ambulatory Visit
Admission: RE | Admit: 2015-02-03 | Discharge: 2015-02-03 | Disposition: A | Discharge status: Home / Self Care | Payer: Managed Care, Other (non HMO) | Source: Ambulatory Visit | Attending: Interventional Radiology | Admitting: Interventional Radiology

## 2015-02-03 DIAGNOSIS — D259 Leiomyoma of uterus, unspecified: Secondary | ICD-10-CM

## 2015-02-03 NOTE — Progress Notes (Signed)
Patient ID: Annette Brooks, female   DOB: 1965/07/13, 49 y.o.   MRN: [id number]    Chief Complaint: Patient was seen in consultation today for  Chief Complaint  Patient presents with  . Follow-up    2 mo follow up [place]    History of Present Illness: Annette Brooks is a 49 y.o. female who is 2 months status post uterine fibroid embolization. Her symptoms have markedly improved. She still does have some episodic menorrhagia. Her cramping is also improved. She denies any fevers or chills. She denies any foul discharge.  Past Medical History  Diagnosis Date  . Anxiety state, unspecified 02/16/2009  . ASTHMA 12/11/2006  . Bariatric surgery status 02/16/2009  . COLONIC POLYPS, HX OF 12/11/2006  . GERD 12/11/2006  . OBESITY 05/10/2008  . Acne   . HYPERTHYROIDISM 11/02/2009  . Hidradenitis suppurativa   . Anemia   . Allergy   . Fibroids     Past Surgical History  Procedure Laterality Date  . Gastric bypass    . Breast surgery      cysts  . Tubal ligation    . Myomectomy    . Pilonidal cyst excision    . Cholecystectomy N/A 09/21/2014    Procedure: LAPAROSCOPIC CHOLECYSTECTOMY WITH INTRAOPERATIVE CHOLANGIOGRAM WITH UPPER ENDOSCOPY;  Surgeon: Alphonsa Overall, MD;  Location: [facility];  Service: General;  Laterality: N/A;  . Dilate esophogus  2015    Allergies: Hydrocodone  Medications: Prior to Admission medications   Medication Sig Start Date End Date Taking? Authorizing Provider  acetaminophen (TYLENOL) 500 MG tablet Take 1,000 mg by mouth every 8 (eight) hours as needed for mild pain (legs cramps).     Historical Provider, MD  albuterol (PROVENTIL HFA;VENTOLIN HFA) 108 (90 BASE) MCG/ACT inhaler Inhale 2 puffs into the lungs every 6 (six) hours as needed for wheezing. 05/15/13   Lisabeth Pick, MD  Calcium Citrate-Vitamin D (CALCIUM + D PO) Take 1 tablet by mouth every morning.     Historical Provider, MD  Cyanocobalamin (VITAMIN B 12 PO) Take 1 tablet by mouth every morning.      Historical Provider, MD  docusate sodium (COLACE) 100 MG capsule Take 1 capsule (100 mg total) by mouth 2 (two) times daily. 12/11/14   Hedy Jacob, PA-C  Ferrous Sulfate (IRON) 325 (65 FE) MG TABS Take 1 tablet by mouth at bedtime.     Historical Provider, MD  GILDESS 1.5/30 1.5-30 MG-MCG tablet Take 1 tablet by mouth every morning.  07/02/14   Historical Provider, MD  ibuprofen (ADVIL,MOTRIN) 600 MG tablet Take 1 tablet (600 mg total) by mouth every 6 (six) hours as needed for moderate pain. 12/11/14   Hedy Jacob, PA-C  Multiple Vitamin (MULTIVITAMIN WITH MINERALS) TABS tablet Take 1 tablet by mouth every morning.     Historical Provider, MD  ondansetron (ZOFRAN) 4 MG tablet Take 1 tablet (4 mg total) by mouth every 8 (eight) hours as needed for nausea or vomiting. 12/11/14   Hedy Jacob, PA-C  oxyCODONE-acetaminophen (PERCOCET/ROXICET) 5-325 MG per tablet Take 1-2 tablets by mouth every 4 (four) hours as needed for moderate pain. 12/11/14   Hedy Jacob, PA-C  pantoprazole (PROTONIX) 40 MG tablet Take 1 tablet (40 mg total) by mouth 2 (two) times daily. Patient taking differently: Take 40 mg by mouth daily.  10/12/13   Kennyth Arnold, FNP  ranitidine (ZANTAC) 150 MG tablet Take 150 mg by mouth daily as needed for heartburn.  Historical Provider, MD  senna (SENOKOT) 8.6 MG tablet Take 1 tablet by mouth daily.    Historical Provider, MD  traZODone (DESYREL) 50 MG tablet Take 2 tablets (100 mg total) by mouth at bedtime as needed for sleep. Patient not taking: Reported on 12/14/2014 05/15/13   Lisabeth Pick, MD     Family History  Problem Relation Age of Onset  . Cancer      colon ca  . Stroke      family  . Hypertension Father   . Diabetes Father   . Hyperlipidemia Father   . Hypertension Mother   . Crohn's disease Sister   . Diabetes Sister     Social History   Social History  . Marital Status: Single    Spouse Name: N/A  . Number of Children: 1  . Years of  Education: N/A   Occupational History  . Medical claims [employer]   Social History Main Topics  . Smoking status: Former Smoker    Quit date: 01/09/2002  . Smokeless tobacco: Never Used  . Alcohol Use: Yes     Comment: 3-4 beers daily   . Drug Use: No  . Sexual Activity: Yes    Birth Control/ Protection: Surgical     Comment: BTL    Other Topics Concern  . Not on file   Social History Narrative     Review of Systems: A 12 point ROS discussed and pertinent positives are indicated in the HPI above.  All other systems are negative.  Review of Systems  Vital Signs: BP 131/79 mmHg  Pulse 84  Temp(Src) 97.6 F (36.4 C) (Oral)  Resp 14  SpO2 99%  Physical Exam  Musculoskeletal:  Examination of her right groin reveals no hematoma or swelling. Femoral pulses are intact. Dorsalis pedis and posterior tibial pulses are 2+.    Imaging: No results found.  Labs:  CBC:  Recent Labs  07/25/14 1214 09/16/14 0855 12/10/14 1119  WBC 7.2 6.6 6.1  HGB 10.2* 9.7* 12.7  HCT 33.0* 31.8* 39.6  PLT 432* 427* 353    COAGS:  Recent Labs  12/10/14 1119  INR 0.93    BMP:  Recent Labs  07/25/14 1214 09/16/14 0855 12/10/14 1119  NA 137 139 138  K 4.8 4.4 4.2  CL 108 109 110  CO2 20 23 20*  GLUCOSE 79 93 88  BUN 9 10 12   CALCIUM 8.4 8.5* 8.7*  CREATININE 0.70 0.75 0.51  GFRNONAA >90 >60 >60  GFRAA >90 >60 >60    LIVER FUNCTION TESTS:  Recent Labs  07/25/14 1214 09/16/14 0855  BILITOT 0.5 0.3  AST 22 18  ALT 13 12*  ALKPHOS 70 66  PROT 7.6 7.6  ALBUMIN 3.4* 3.5    TUMOR MARKERS: No results for input(s): AFPTM, CEA, CA199, CHROMGRNA in the last 8760 hours.  Assessment and Plan:  [name] is doing very well after uterine artery embolization without complication. She will follow-up at the six-month interval.   Signed: Briyana Badman, ART A 02/03/2015, 2:42 PM   I spent a total of  10 Minutes in face to face in clinical consultation, greater  than 50% of which was counseling/coordinating care for uterine fibroid embolization.

## 2015-03-22 ENCOUNTER — Telehealth: Payer: Self-pay | Admitting: Radiology

## 2015-03-22 NOTE — Telephone Encounter (Signed)
Left message on patient's M# to inquire if patient wants to schedule a follow up appointment with Dr Barbie Banner prior to her move to [place] (scheduled for January 2016).  Adelayde Minney Riki Rusk, RN 03/22/2015 11:56 AM

## 2016-04-02 IMAGING — RF DG CHOLANGIOGRAM OPERATIVE
1 series · 5 of 5 positions shown · non-contrast
Comparison: None.

CLINICAL DATA: Right upper quadrant pain

EXAM:
INTRAOPERATIVE CHOLANGIOGRAM
TECHNIQUE: Cholangiographic images from the C-arm fluoroscopic device were
submitted for interpretation post-operatively. Please see the
procedural report for the amount of contrast and the fluoroscopy
time utilized.

[Series 1: run · 2 acquisitions, 5 frames shown]
[im 1/2]
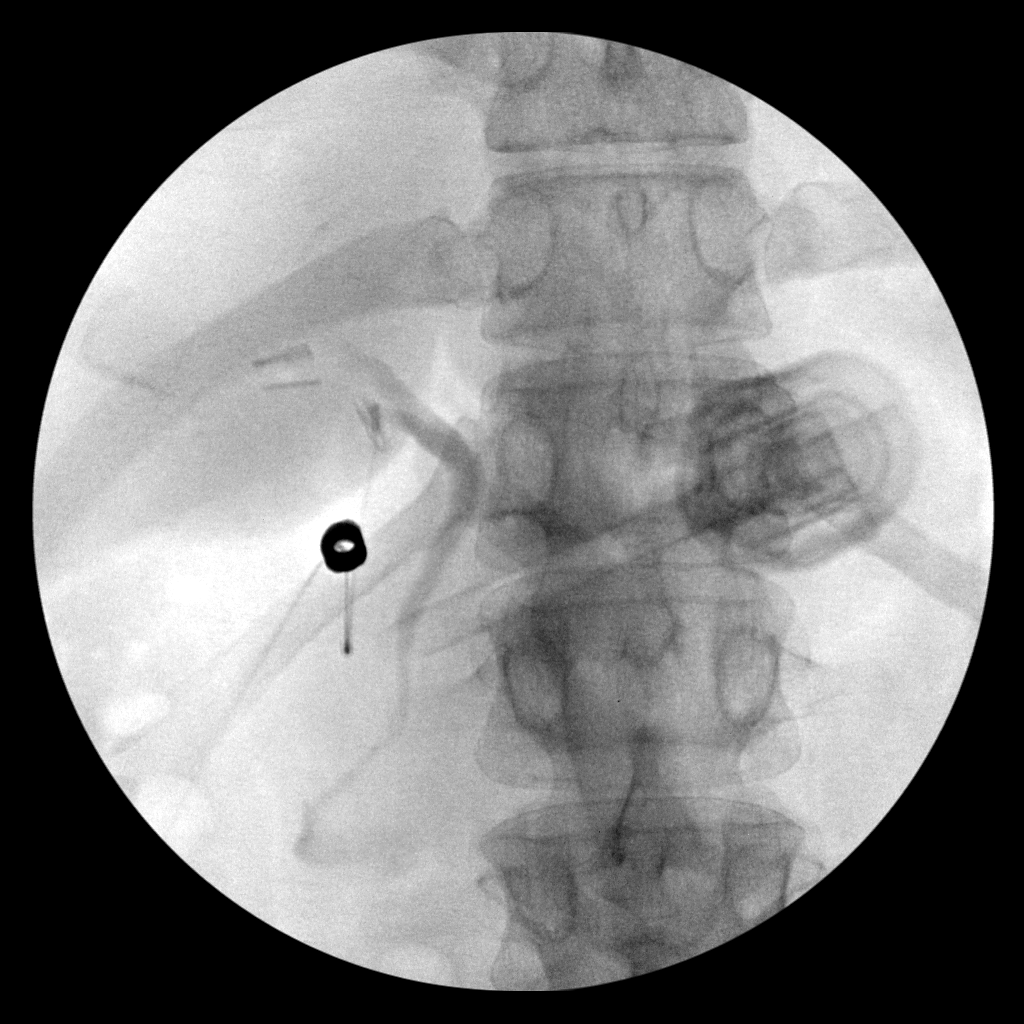
[im 1/2]
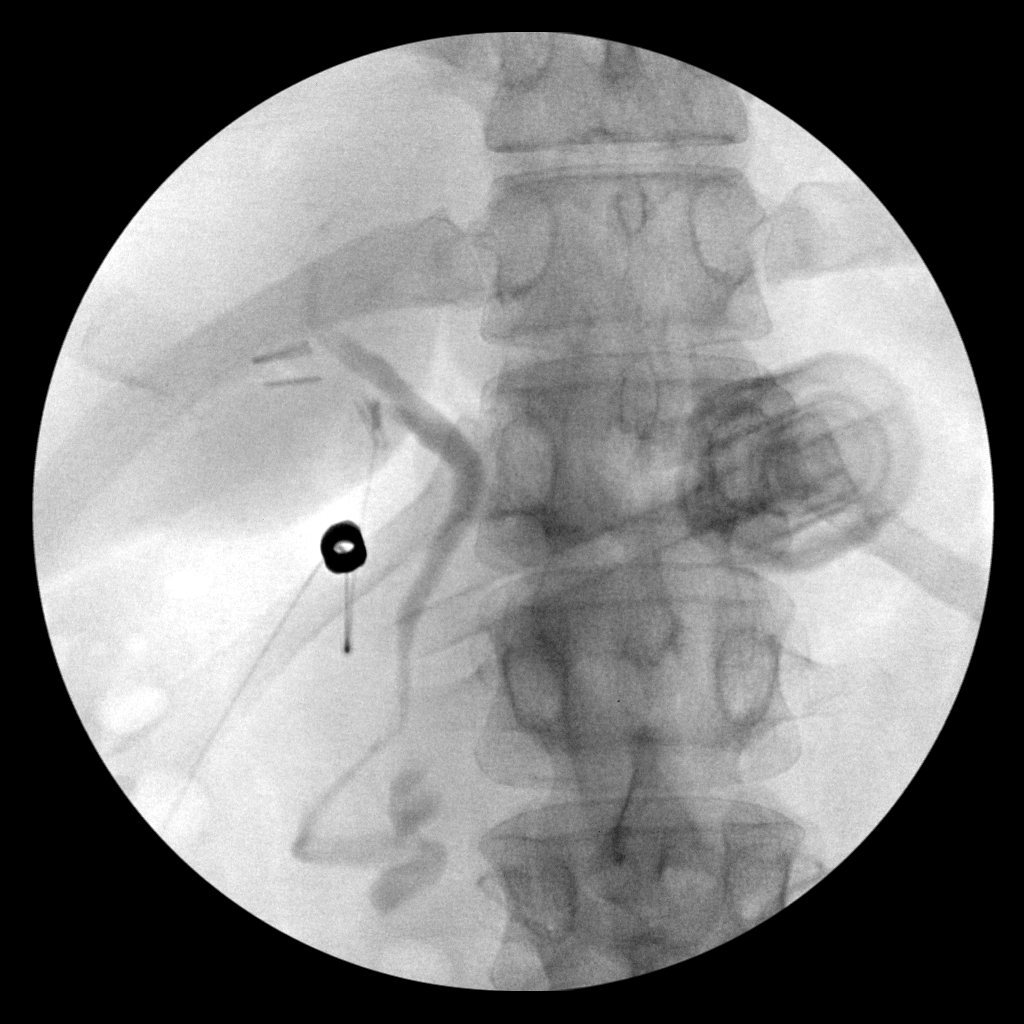
[im 1/2]
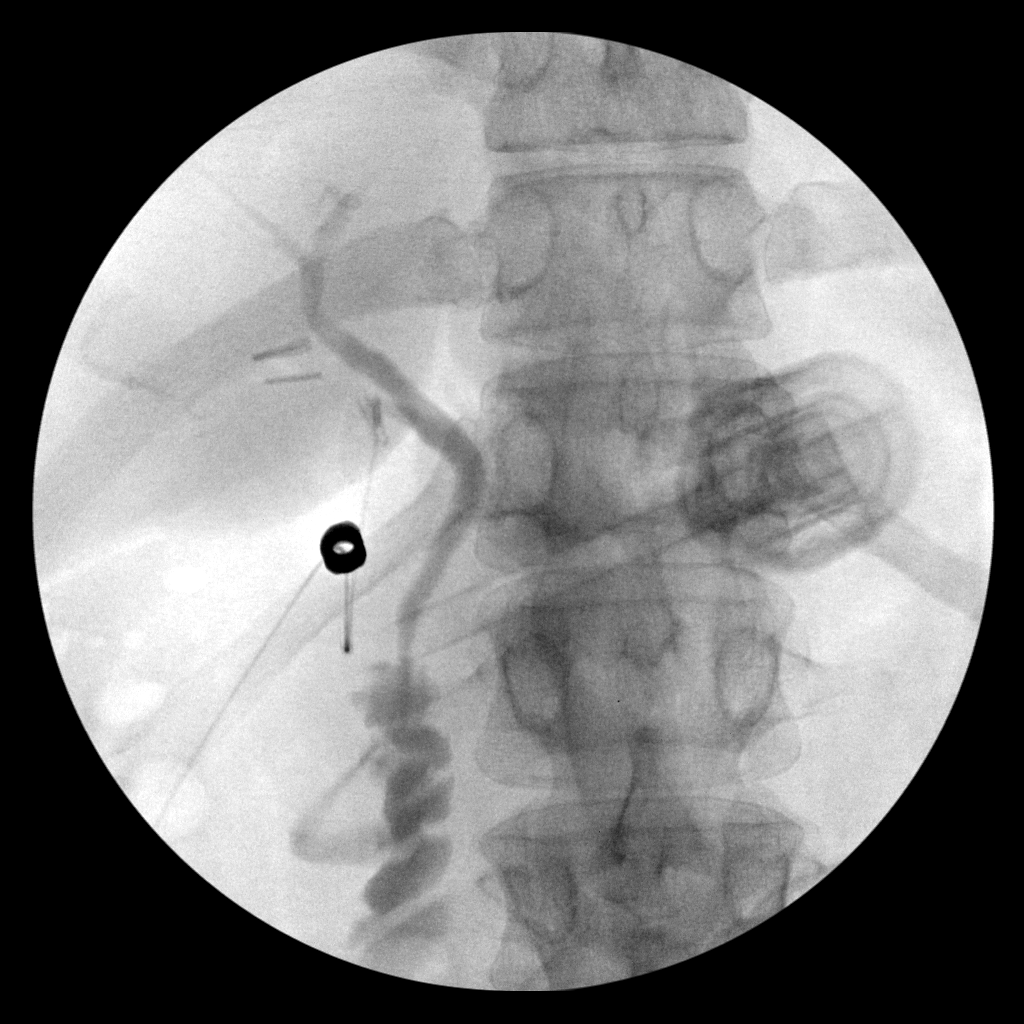
[im 1/2]
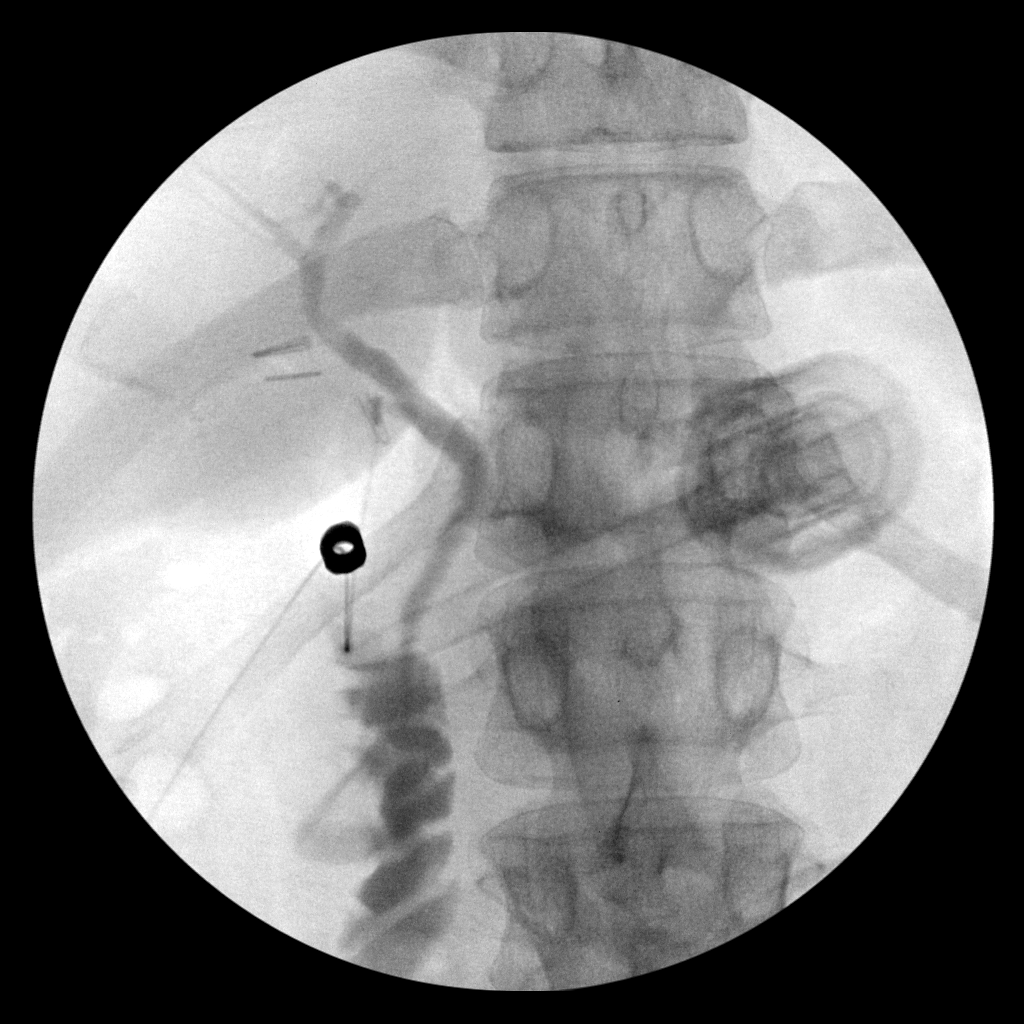
[im 2/2]
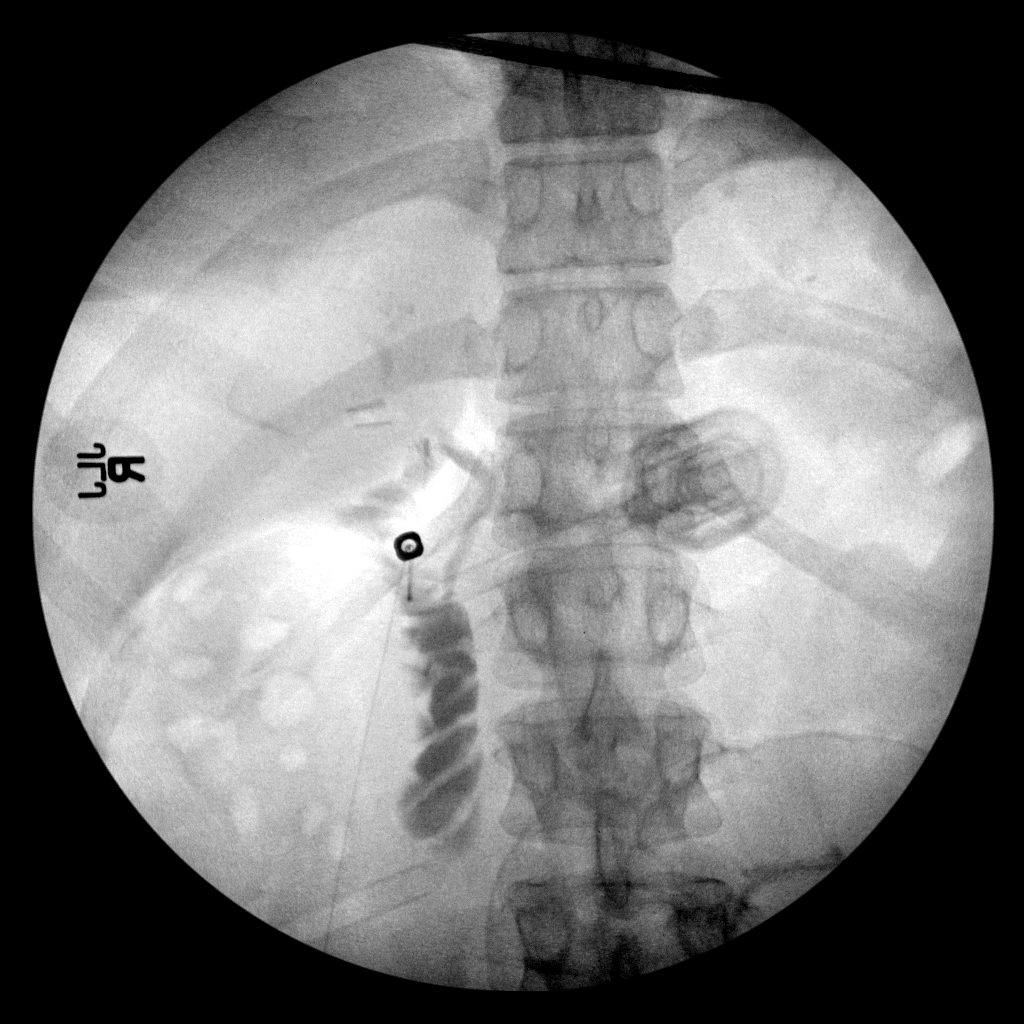

[5 of 5 positions shown; findings below may reference images not displayed]

FINDINGS: Contrast fills the biliary tree and duodenum without filling defects
in the common bile duct.
IMPRESSION: Patent biliary tree without evidence of common bile duct stones.

## 2016-05-25 IMAGING — MR MR PELVIS WO/W CM
5 of 10 series · 23 of 48 positions shown · IV contrast (multihance)
Comparison: CT abdomen/pelvis 11/14/2008. No prior ultrasound for
comparison.

CLINICAL DATA: Menorrhagia, uterine fibroids.  Pre [place] evaluation.

EXAM:
MRI PELVIS WITHOUT AND WITH CONTRAST
TECHNIQUE: Multiplanar multisequence MR imaging of the pelvis was performed
both before and after administration of intravenous contrast.
CONTRAST:  19 cc MultiHance IV contrast

[Series 3: T2 · coronal · 6.0mm · 0.78mm/px · 4 of 26 slices shown]
[im 1/26]
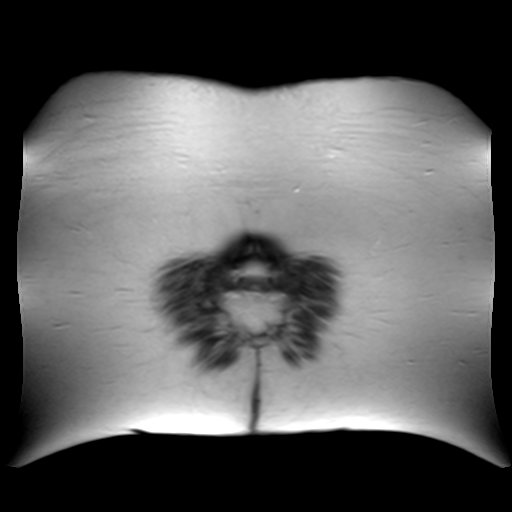
[im 9/26]
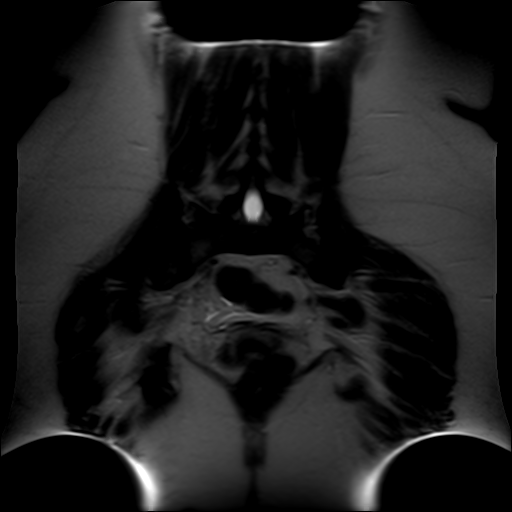
[im 17/26]
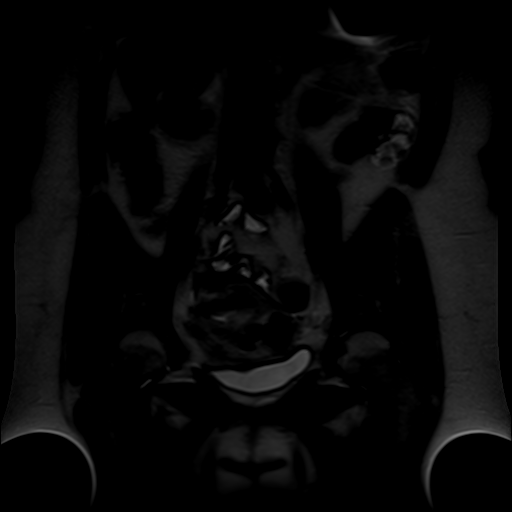
[im 26/26]
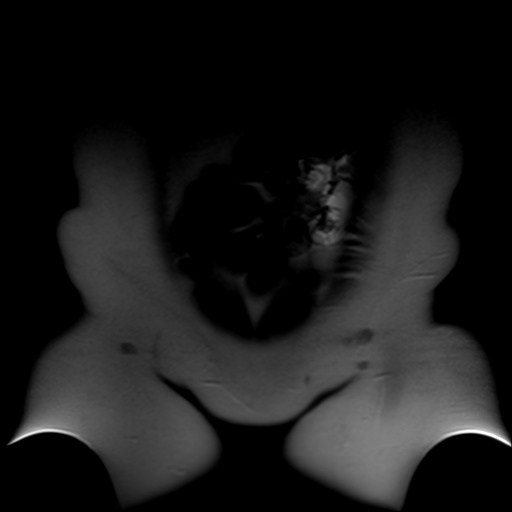

[Series 4: t2_tse_sag · sagittal · 5.0mm · 0.51mm/px · 4 of 25 slices shown]
[im 1/25]
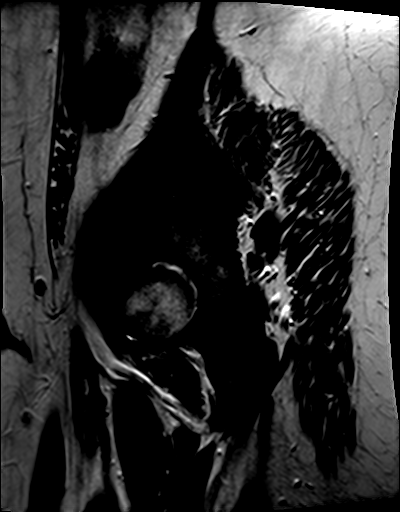
[im 9/25]
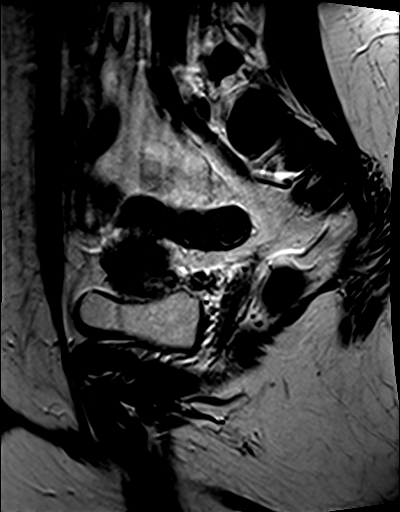
[im 17/25]
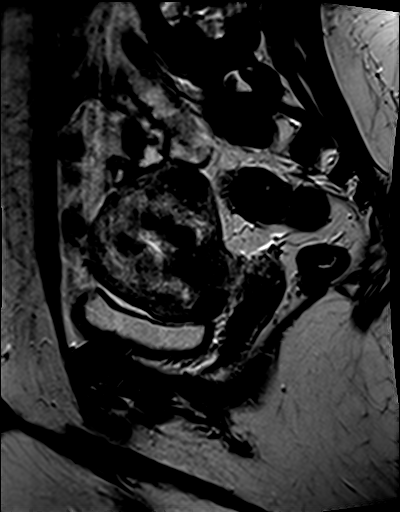
[im 25/25]
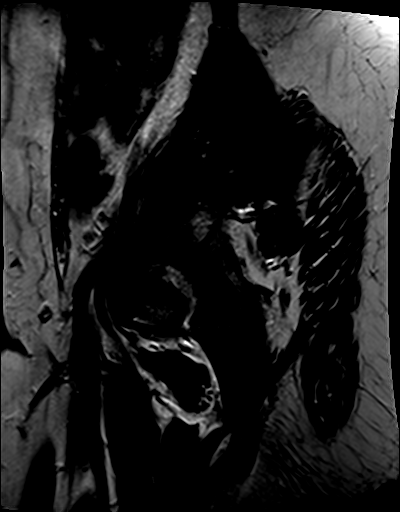

[Series 5: t2_tse axial · axial · 5.0mm · 0.51mm/px · z∈[-68,+88]mm · 5 of 27 slices shown]
[im 1/27]
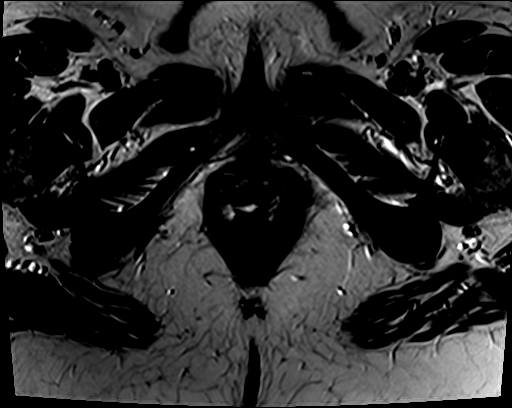
[im 7/27]
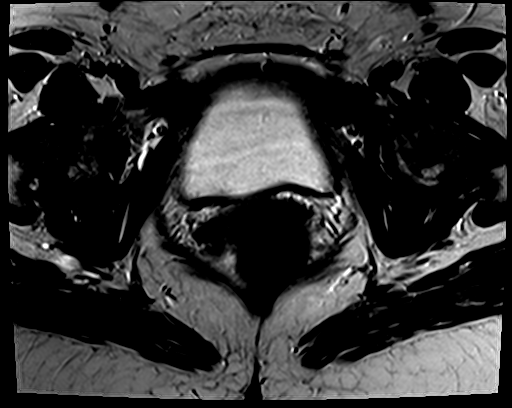
[im 14/27]
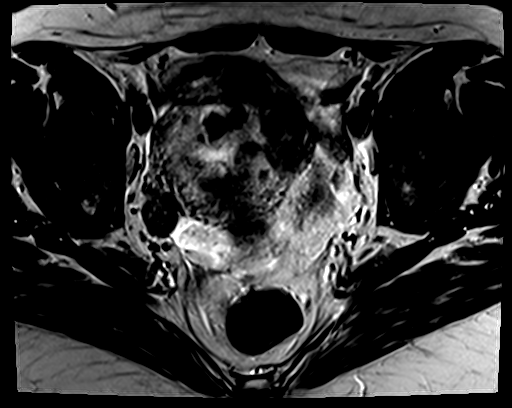
[im 20/27]
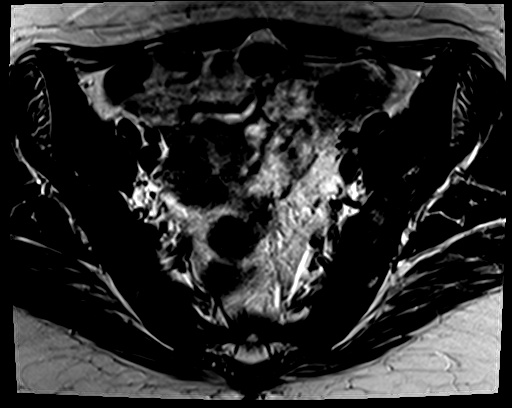
[im 27/27]
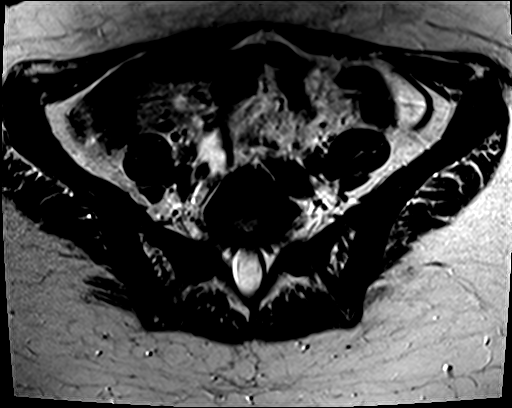

[Series 6: t2_tse axial fs · axial · 5.0mm · 0.51mm/px · z∈[-68,+88]mm · 5 of 27 slices shown]
[im 1/27]
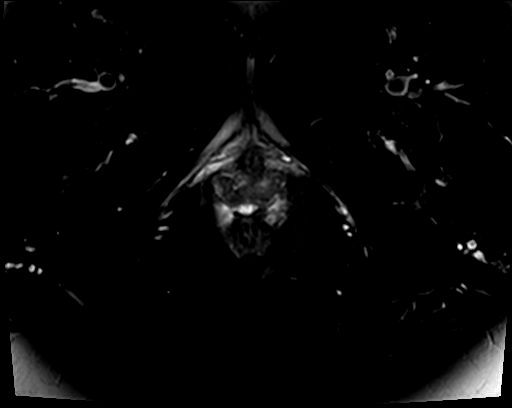
[im 7/27]
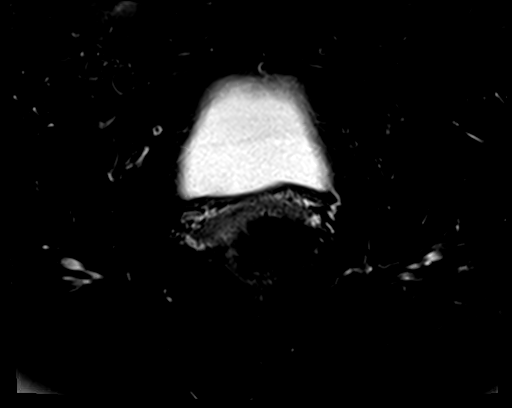
[im 14/27]
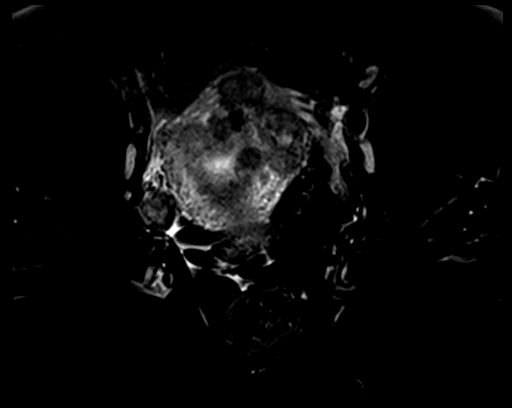
[im 20/27]
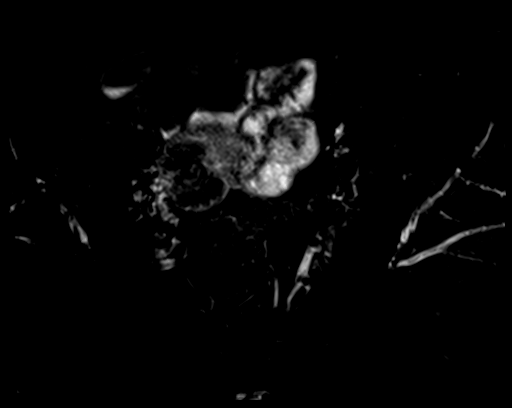
[im 27/27]
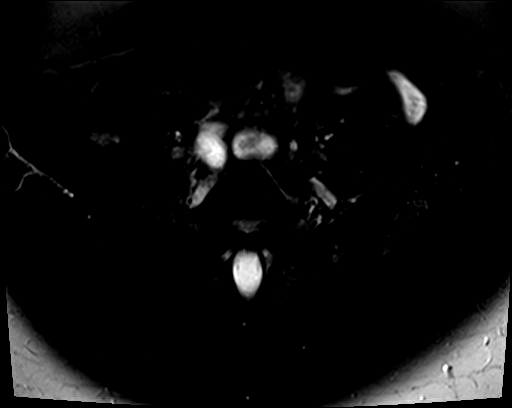

[Series 7: axial spgr · axial · 5.0mm · 0.51mm/px · z∈[-68,+88]mm · 5 of 27 slices shown]
[im 1/27]
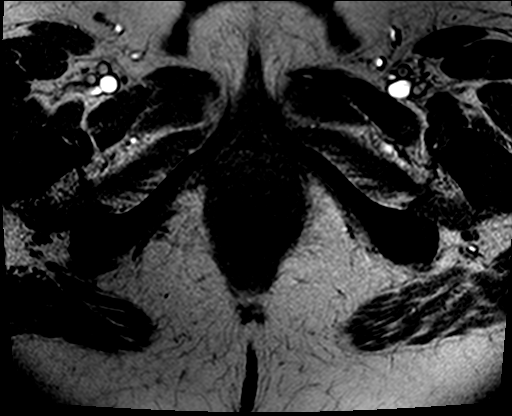
[im 7/27]
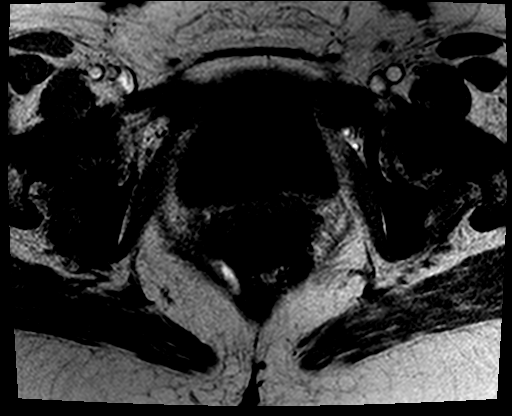
[im 14/27]
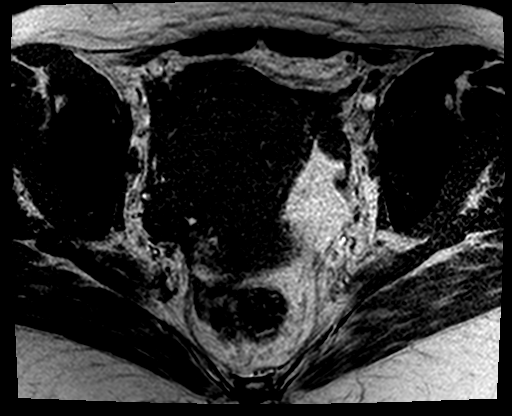
[im 20/27]
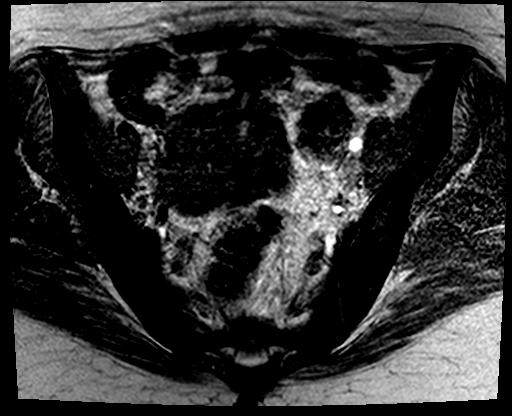
[im 27/27]
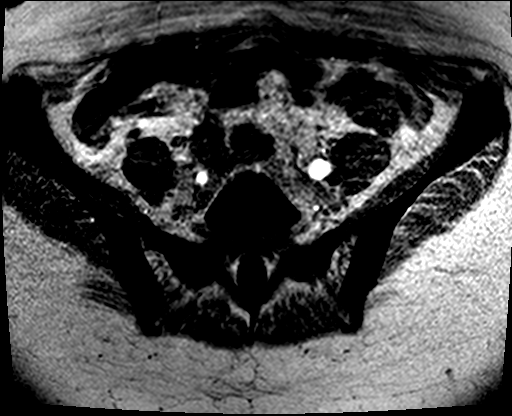

[23 of 48 positions shown; findings below may reference images not displayed]

FINDINGS: Uterus is anteverted and anteflexed, measuring 9.7 x 8.4 x 6.7 cm,
volume 287 cc. Multiple fibroids are noted with dominant lesions as
follows:

Anterior uterine fundus, predominantly subserosal, 2.9 x 1.9 x
cm.

Left lateral uterine body, intramural, 3.8 x 2.4 x 3.2 cm.

Right lateral low uterine body, predominantly subserosal, 2.0 x
x 1.8 cm.

There are numerous fibroids measuring 1.6 cm and smaller with
partial intramural and partial submucosal components producing mass
effect upon the endometrium.

Fibroids are predominantly T2 hypo intense. No T1 weighted
hyperintensity to suggest hemorrhage/ red degeneration. After
administration of contrast, there is fairly uniform enhancement of
the largest uterine fibroids described above, with only a few areas
of relative hypoenhancement in several predominantly intramural
small fibroid. No abnormal enhancement within the pelvis is
otherwise identified.

The ovaries are unremarkable. Trace pelvic fluid. Bladder is normal.

No acute osseous abnormality.  No lymphadenopathy.
IMPRESSION: Uterine fibroids as above, with dominant and predominantly
subserosal fibroids reported separately. There are numerous small
intramural fibroids with submucosal components and mass effect upon
the endometrium.

## 2016-06-04 ENCOUNTER — Encounter (HOSPITAL_COMMUNITY): Payer: Self-pay

## 2017-06-07 ENCOUNTER — Encounter (HOSPITAL_COMMUNITY): Payer: Self-pay

## 2017-07-18 ENCOUNTER — Telehealth (HOSPITAL_COMMUNITY): Payer: Self-pay

## 2017-07-18 NOTE — Telephone Encounter (Signed)
Declined appt/ has moved out of state/relocated to [place], [place] [address] This patient is overdue for recommended follow-up with a bariatric surgeon at [facility]. A letter was mailed to the updated [place] address found in Care everywhere 06/07/17 from both [facility] in attempt to reestablish post-op care and/or confirm new residence and need for transferring patient's bariatric chart along with center transfer needs for [facility].  The letter included a patient survey which was returned to the [facility] Bariatric Dept.  Patient declined an appointment confirming that she has moved to [place], [place] /[place] area and following up with a gastroenterologist locally."  Information was shared with [facility] for following up on need to transfer records for specific bariatric care.
